# Patient Record
Sex: Female | Born: 1995 | Race: Black or African American | Hispanic: No | Marital: Single | Smoking: Current every day smoker
Health system: Southern US, Community
[De-identification: ages and names within clinical notes are randomized; demographics above are authoritative.]

## PROBLEM LIST (undated history)

## (undated) ENCOUNTER — Emergency Department (HOSPITAL_COMMUNITY): Admission: EM | Payer: Self-pay

## (undated) ENCOUNTER — Inpatient Hospital Stay (HOSPITAL_COMMUNITY): Payer: Self-pay

## (undated) ENCOUNTER — Inpatient Hospital Stay (HOSPITAL_COMMUNITY): Payer: Medicaid Other

## (undated) DIAGNOSIS — N938 Other specified abnormal uterine and vaginal bleeding: Secondary | ICD-10-CM

## (undated) DIAGNOSIS — F329 Major depressive disorder, single episode, unspecified: Secondary | ICD-10-CM

## (undated) DIAGNOSIS — N390 Urinary tract infection, site not specified: Secondary | ICD-10-CM

## (undated) DIAGNOSIS — Z3403 Encounter for supervision of normal first pregnancy, third trimester: Secondary | ICD-10-CM

## (undated) DIAGNOSIS — J302 Other seasonal allergic rhinitis: Secondary | ICD-10-CM

## (undated) DIAGNOSIS — F32A Depression, unspecified: Secondary | ICD-10-CM

## (undated) DIAGNOSIS — A549 Gonococcal infection, unspecified: Secondary | ICD-10-CM

## (undated) HISTORY — DX: Gonococcal infection, unspecified: A54.9

## (undated) HISTORY — DX: Urinary tract infection, site not specified: N39.0

## (undated) HISTORY — PX: NO PAST SURGERIES: SHX2092

---
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
## 2015-01-07 ENCOUNTER — Ambulatory Visit: Payer: Medicaid Other

## 2015-01-21 ENCOUNTER — Ambulatory Visit: Payer: Self-pay | Admitting: *Deleted

## 2015-01-22 ENCOUNTER — Encounter: Payer: Self-pay | Admitting: Licensed Clinical Social Worker

## 2015-01-25 ENCOUNTER — Encounter (INDEPENDENT_AMBULATORY_CARE_PROVIDER_SITE_OTHER): Payer: Self-pay

## 2015-01-25 ENCOUNTER — Ambulatory Visit (INDEPENDENT_AMBULATORY_CARE_PROVIDER_SITE_OTHER): Payer: Medicaid Other | Admitting: *Deleted

## 2015-01-25 ENCOUNTER — Ambulatory Visit: Payer: Medicaid Other

## 2015-01-25 DIAGNOSIS — Z309 Encounter for contraceptive management, unspecified: Secondary | ICD-10-CM

## 2015-01-25 DIAGNOSIS — Z3042 Encounter for surveillance of injectable contraceptive: Secondary | ICD-10-CM

## 2015-01-25 DIAGNOSIS — Z3049 Encounter for surveillance of other contraceptives: Secondary | ICD-10-CM | POA: Diagnosis not present

## 2015-01-25 MED ORDER — MEDROXYPROGESTERONE ACETATE 150 MG/ML IM SUSP
150.0000 mg | Freq: Once | INTRAMUSCULAR | Status: AC
  Administered 2015-01-25: 150 mg via INTRAMUSCULAR

## 2015-01-25 NOTE — Progress Notes (Signed)
Pt presents here today for depot. Pt within window, no pregnancy test needed. No previous history of reaction. No concerns. Tolerated well.

## 2015-02-05 ENCOUNTER — Ambulatory Visit: Payer: Medicaid Other | Admitting: Pediatrics

## 2015-02-06 ENCOUNTER — Institutional Professional Consult (permissible substitution): Payer: Medicaid Other | Admitting: Pediatrics

## 2015-02-14 ENCOUNTER — Institutional Professional Consult (permissible substitution): Payer: Medicaid Other | Admitting: Pediatrics

## 2015-02-14 ENCOUNTER — Encounter: Payer: Self-pay | Admitting: Pediatrics
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
## 2015-03-28 ENCOUNTER — Ambulatory Visit: Payer: Medicaid Other | Admitting: *Deleted

## 2015-04-03 ENCOUNTER — Ambulatory Visit: Payer: Medicaid Other | Admitting: Pediatrics

## 2015-04-08 ENCOUNTER — Ambulatory Visit: Payer: Medicaid Other | Admitting: *Deleted

## 2015-04-12 ENCOUNTER — Ambulatory Visit (INDEPENDENT_AMBULATORY_CARE_PROVIDER_SITE_OTHER): Payer: Medicaid Other | Admitting: *Deleted

## 2015-04-12 ENCOUNTER — Encounter (INDEPENDENT_AMBULATORY_CARE_PROVIDER_SITE_OTHER): Payer: Self-pay

## 2015-04-12 DIAGNOSIS — Z309 Encounter for contraceptive management, unspecified: Secondary | ICD-10-CM | POA: Diagnosis not present

## 2015-04-12 DIAGNOSIS — Z30013 Encounter for initial prescription of injectable contraceptive: Secondary | ICD-10-CM | POA: Diagnosis not present

## 2015-04-12 DIAGNOSIS — Z3042 Encounter for surveillance of injectable contraceptive: Secondary | ICD-10-CM

## 2015-04-12 MED ORDER — MEDROXYPROGESTERONE ACETATE 150 MG/ML IM SUSP
150.0000 mg | Freq: Once | INTRAMUSCULAR | Status: AC
  Administered 2015-04-12: 150 mg via INTRAMUSCULAR

## 2015-04-12 NOTE — Progress Notes (Signed)
Patient here for injection of depo. Within window of calendar so pregnancy test not needed.

## 2015-04-15 ENCOUNTER — Ambulatory Visit: Payer: Medicaid Other | Admitting: *Deleted

## 2015-04-19 ENCOUNTER — Institutional Professional Consult (permissible substitution): Payer: Medicaid Other | Admitting: Family

## 2015-05-07 ENCOUNTER — Encounter: Payer: Self-pay | Admitting: Pediatrics

## 2015-05-10 ENCOUNTER — Encounter: Payer: Self-pay | Admitting: Family

## 2015-05-10 ENCOUNTER — Ambulatory Visit (INDEPENDENT_AMBULATORY_CARE_PROVIDER_SITE_OTHER): Payer: Medicaid Other | Admitting: Family

## 2015-05-10 VITALS — BP 103/66 | HR 91 | Ht 65.35 in | Wt 181.6 lb

## 2015-05-10 DIAGNOSIS — Z130 Encounter for screening for diseases of the blood and blood-forming organs and certain disorders involving the immune mechanism: Secondary | ICD-10-CM

## 2015-05-10 DIAGNOSIS — A6000 Herpesviral infection of urogenital system, unspecified: Secondary | ICD-10-CM | POA: Diagnosis not present

## 2015-05-10 DIAGNOSIS — Z3042 Encounter for surveillance of injectable contraceptive: Secondary | ICD-10-CM | POA: Diagnosis not present

## 2015-05-10 DIAGNOSIS — Z1329 Encounter for screening for other suspected endocrine disorder: Secondary | ICD-10-CM

## 2015-05-10 DIAGNOSIS — Z13228 Encounter for screening for other metabolic disorders: Secondary | ICD-10-CM

## 2015-05-10 DIAGNOSIS — Z1321 Encounter for screening for nutritional disorder: Secondary | ICD-10-CM

## 2015-05-10 DIAGNOSIS — Z139 Encounter for screening, unspecified: Secondary | ICD-10-CM | POA: Diagnosis not present

## 2015-05-10 MED ORDER — ACYCLOVIR 400 MG PO TABS: ORAL_TABLET | ORAL | Status: DC

## 2015-05-10 NOTE — Progress Notes (Signed)
Adolescent Medicine Consultation Follow-Up Visit Taylor Reed  is a 19 y.o. female referred by Burnard Hawthorne, MD here today for follow-up of .   Previsit planning completed:  no  Growth Chart Viewed? no  PCP Confirmed?  Yes, Marge Duncans, MD    History was provided by the patient.  HPI:  19 yo female presents for consult. Her PMH is significant for HSV, hx of gonorrhea. She denies being currently active and she has had no lesions since the primary genital outbreak on 12/13/14. She describes that outbreak as one ulcerative lesion she noted after burning sensation with urination. Since that time, she had one other incident when she started to have an itch in the genital area. At that time, she took acyclovir and never had an outbreak. Does not have any issues currently but is requesting refill on acyclovir just in case. Has been on Depo for period regularity and birth control since 11th grade. She has no other complains or concerns today although she does question the impact of HSV on future pregnancies. When asked about desires to become pregnant, she states she does not want a family any time soon; Desires to go to school first.  Her mother requests Vitamin D level to be tested; states she has low Vit D level and was also on Depo for a long period of time. Mom states she gives her Vitamin D supplement but not daily.   Patient's last menstrual period was 04/22/2015 (approximate).  The following portions of the patient's history were reviewed and updated as appropriate: allergies, current medications, past family history, past medical history, past social history, past surgical history and problem list.  Allergies  Allergen Reactions  . Latex Rash    Social History: Sleep:  10pm-9a, no early morning wakings Eating Habits: reports junk food, twice daily meals, little appetite Screen Time: on phone most of day, no TV Exercise: walking around neighborhood  School: graduated from [employer]: was supposed to start school last semester, [employer] in fall for early childhood development Lives with mom, sister and 2 brothers  Confidentiality was discussed with the patient and if applicable, with caregiver as well.  Patient's personal or confidential phone number: [email or web address]  Tobacco? Yes, occasional cigarette smoking 2/day Secondhand smoke exposure?yes Drugs/EtOH?no Sexually active?no Pregnancy Prevention: depo, reviewed condoms & plan B Safe at home, in school & in relationships? Yes Guns in the home? no Safe to self? Yes  Review of Systems  Constitutional: Negative.   HENT: Negative.   Eyes: Negative.   Respiratory: Negative.   Cardiovascular: Negative.   Gastrointestinal: Negative.   Genitourinary: Negative.   Musculoskeletal: Negative.   Skin: Negative.   Neurological: Negative.   Endo/Heme/Allergies: Negative.   Psychiatric/Behavioral: Negative.    Physical Exam:  Filed Vitals:   05/10/15 1426  BP: 103/66  Pulse: 91  Height: 5' 5.35" (1.66 m)  Weight: 181 lb 9.6 oz (82.373 kg)   BP 103/66 mmHg  Pulse 91  Ht 5' 5.35" (1.66 m)  Wt 181 lb 9.6 oz (82.373 kg)  BMI 29.89 kg/m2  LMP 04/22/2015 (Approximate) Body mass index: body mass index is 29.89 kg/(m^2). Blood pressure percentiles are 22% systolic and 53% diastolic based on 2000 NHANES data. Blood pressure percentile targets: 90: 125/79, 95: 128/83, 99 + 5 mmHg: 141/96.  Physical Exam  Constitutional: She is oriented to person, place, and time. She appears well-developed. No distress.  HENT:  Head: Normocephalic and atraumatic.  Eyes: EOM are normal.  Pupils are equal, round, and reactive to light. No scleral icterus.  Neck: Normal range of motion. Neck supple. No thyromegaly present.  Cardiovascular: Normal rate, regular rhythm, normal heart sounds and intact distal pulses.   No murmur heard. Pulmonary/Chest: Effort normal and breath sounds normal.  Abdominal: Soft.   Musculoskeletal: Normal range of motion. She exhibits no edema.  Lymphadenopathy:    She has no cervical adenopathy.  Neurological: She is alert and oriented to person, place, and time. No cranial nerve deficit.  Skin: Skin is warm and dry. No rash noted.  Psychiatric: She has a normal mood and affect. Her behavior is normal. Judgment and thought content normal.  Nursing note and vitals reviewed.  Assessment/Plan:  1. Herpes genitalia Refill for use at onset of symptoms; asymptomatic since primary outbreak. Discussed prodromal symptoms to be aware of and also addressed her concerns related to future pregnancies. She was advised to use condoms with all sexual encounters. Also discussed suppression therapy as option if more outbreaks or concern for viral shedding. Reassurance was given to patient. She had no further questions or concerns.  - acyclovir (ZOVIRAX) 400 MG tablet; Take 400 mg three times daily for 5 days if lesions recur.  Dispense: 15 tablet; Refill: 0  2. Screening for endocrine, nutritional, metabolic and immunity disorder Pending, encouraged to continue daily intake of Vit D supplement  - Vit D  25 hydroxy (rtn osteoporosis monitoring)  3. Surveillance of Depo-Provera contraception  -Patient desires to continue Depo; discussed LARC options if she desires a long-term contraception method. Condoms reviewed and offered.   Follow-up:  Return in about 8 weeks (around 07/02/2015) for scheduled Depo injection; return PRN .   Medical decision-making:  > 40 minutes spent, more than 50% of appointment was spent discussing diagnosis and management of symptoms

## 2015-07-02 ENCOUNTER — Encounter: Payer: Self-pay | Admitting: Pediatrics

## 2015-07-02 ENCOUNTER — Ambulatory Visit (INDEPENDENT_AMBULATORY_CARE_PROVIDER_SITE_OTHER): Payer: Medicaid Other | Admitting: Pediatrics

## 2015-07-02 VITALS — BP 102/64 | Ht 63.19 in | Wt 180.1 lb

## 2015-07-02 DIAGNOSIS — Z113 Encounter for screening for infections with a predominantly sexual mode of transmission: Secondary | ICD-10-CM | POA: Diagnosis not present

## 2015-07-02 DIAGNOSIS — Z68.41 Body mass index (BMI) pediatric, greater than or equal to 95th percentile for age: Secondary | ICD-10-CM | POA: Diagnosis not present

## 2015-07-02 DIAGNOSIS — Z3042 Encounter for surveillance of injectable contraceptive: Secondary | ICD-10-CM | POA: Diagnosis not present

## 2015-07-02 DIAGNOSIS — Z91040 Latex allergy status: Secondary | ICD-10-CM | POA: Diagnosis not present

## 2015-07-02 DIAGNOSIS — L709 Acne, unspecified: Secondary | ICD-10-CM

## 2015-07-02 DIAGNOSIS — Z00121 Encounter for routine child health examination with abnormal findings: Secondary | ICD-10-CM | POA: Diagnosis not present

## 2015-07-02 DIAGNOSIS — A6000 Herpesviral infection of urogenital system, unspecified: Secondary | ICD-10-CM

## 2015-07-02 DIAGNOSIS — IMO0002 Reserved for concepts with insufficient information to code with codable children: Secondary | ICD-10-CM

## 2015-07-02 LAB — CBC WITH DIFFERENTIAL/PLATELET
BASOS PCT: 0 % (ref 0–1)
Basophils Absolute: 0 10*3/uL (ref 0.0–0.1)
EOS ABS: 0.2 10*3/uL (ref 0.0–0.7)
EOS PCT: 3 % (ref 0–5)
HCT: 39.8 % (ref 36.0–46.0)
Hemoglobin: 13.1 g/dL (ref 12.0–15.0)
Lymphocytes Relative: 36 % (ref 12–46)
Lymphs Abs: 2.2 10*3/uL (ref 0.7–4.0)
MCH: 31.1 pg (ref 26.0–34.0)
MCHC: 32.9 g/dL (ref 30.0–36.0)
MCV: 94.5 fL (ref 78.0–100.0)
MONO ABS: 0.7 10*3/uL (ref 0.1–1.0)
MPV: 10.7 fL (ref 8.6–12.4)
Monocytes Relative: 11 % (ref 3–12)
Neutro Abs: 3 10*3/uL (ref 1.7–7.7)
Neutrophils Relative %: 50 % (ref 43–77)
Platelets: 282 10*3/uL (ref 150–400)
RBC: 4.21 MIL/uL (ref 3.87–5.11)
RDW: 13.5 % (ref 11.5–15.5)
WBC: 6 10*3/uL (ref 4.0–10.5)

## 2015-07-02 LAB — COMPREHENSIVE METABOLIC PANEL
ALT: 12 U/L (ref 5–32)
AST: 14 U/L (ref 12–32)
Albumin: 4 g/dL (ref 3.6–5.1)
Alkaline Phosphatase: 63 U/L (ref 47–176)
BUN: 9 mg/dL (ref 7–20)
CO2: 21 mmol/L (ref 20–31)
CREATININE: 0.75 mg/dL (ref 0.50–1.00)
Calcium: 9.5 mg/dL (ref 8.9–10.4)
Chloride: 110 mmol/L (ref 98–110)
Glucose, Bld: 107 mg/dL — ABNORMAL HIGH (ref 65–99)
Potassium: 4.1 mmol/L (ref 3.8–5.1)
Sodium: 142 mmol/L (ref 135–146)
Total Bilirubin: 0.3 mg/dL (ref 0.2–1.1)
Total Protein: 6.6 g/dL (ref 6.3–8.2)

## 2015-07-02 LAB — HEMOGLOBIN A1C
Hgb A1c MFr Bld: 5.4 % (ref <5.7)
MEAN PLASMA GLUCOSE: 108 mg/dL (ref <117)

## 2015-07-02 LAB — LIPID PANEL
CHOLESTEROL: 128 mg/dL (ref 125–170)
HDL: 44 mg/dL (ref 36–76)
LDL Cholesterol: 74 mg/dL (ref <110)
Total CHOL/HDL Ratio: 2.9 Ratio (ref <=5.0)
Triglycerides: 52 mg/dL (ref 40–136)
VLDL: 10 mg/dL (ref <30)

## 2015-07-02 MED ORDER — MEDROXYPROGESTERONE ACETATE 150 MG/ML IM SUSP
150.0000 mg | Freq: Once | INTRAMUSCULAR | Status: AC
  Administered 2015-07-02: 150 mg via INTRAMUSCULAR

## 2015-07-02 MED ORDER — TRETINOIN 0.1 % EX CREA: TOPICAL_CREAM | Freq: Every day | CUTANEOUS | Status: DC

## 2015-07-02 NOTE — Progress Notes (Signed)
Routine Well-Adolescent Visit  PCP: Burnard Hawthorne, MD   History was provided by the patient.  Taylor Reed is a 19 y.o. female who is here for well teen check.  Current concerns: no concerns, no herpetic breakouts.  Adolescent Assessment:  Confidentiality was discussed with the patient and if applicable, with caregiver as well.  Home and Environment:  Lives with: lives at home with mom, and two brothers, and sister  Nutrition/Eating Behaviors: eats a varied diet, lots of fast foods, milk, water, juice to drink at home Sports/Exercise:  Walks some  Education and Employment:  School Status: has graduated from [employer], looking for a job   With parent out of the room and confidentiality discussed:     Smoking: yes, cigarettes, every once in a while, marijuana a lot, 3-4 times a week  Drugs/EtOH: sometimes, has gotten drunk before   Menstruation:   Menarche: post menarchal, onset at age 23 last menses if female: last period was in July, gets depo, period currently started a few days ago, heavy.  Periods have been irregular on the depo, seeems like she gets a period when she is getting near her next depo Menstrual History: flow is moderate and but usually only right before 3 month depo shot   Sexuality:hetero Sexually active? yes - last encounter about a month ago  contraception use: depo and condoms  Mood: Suicidality and Depression: no Weapons: no  Screenings: The patient completed the Rapid Assessment for Adolescent Preventive Services screening questionnaire and the following topics were  discussed: healthy eating, exercise, seatbelt use, tobacco use, marijuana use, drug use and condom use    PHQ-9 completed and results indicated no depression  Physical Exam:  BP 102/64 mmHg  Ht 5' 3.19" (1.605 m)  Wt 180 lb 2 oz (81.704 kg)  BMI 31.72 kg/m2  LMP 05/01/2015 (Approximate) Blood pressure percentiles are 24% systolic and 50% diastolic based on 2000 NHANES  data.   General Appearance:   alert, oriented, no acute distress, well nourished and overweight  HENT: Normocephalic, no obvious abnormality, conjunctiva clear  Mouth:   Normal appearing teeth, no obvious discoloration, dental caries, or dental caps  Neck:   Supple; thyroid: no enlargement, symmetric, no tenderness/mass/nodules  Lungs:   Clear to auscultation bilaterally, normal work of breathing  Heart:   Regular rate and rhythm, S1 and S2 normal, no murmurs;   Abdomen:   Soft, non-tender, no mass, or organomegaly  GU normal female external genitalia, pelvic not performed  Musculoskeletal:   Tone and strength strong and symmetrical, all extremities               Lymphatic:   No cervical adenopathy  Skin/Hair/Nails:   Skin warm, dry and intact, no rashes, no bruises or petechiae, acne pustules on cheeks, stretch marks on abdomen  Neurologic:   Strength, gait, and coordination normal and age-appropriate    Assessment/Plan: 1. Encounter for routine child health examination with abnormal findings BMI: is not appropriate for age  Immunizations today: per orders.  - restart Vitamin D 2000 to 5000 iu per day OTD Vitamin D 3  - Comprehensive metabolic panel - CBC with Differential - Vit D  25 hydroxy (rtn osteoporosis monitoring) - Lipid panel - Hemoglobin A1c - TSH + free T4  2. Routine screening for STI (sexually transmitted infection)  - GC/chlamydia probe amp, urine  3. BMI (body mass index), pediatric, greater than or equal to 95% for age - discussed trying to drink water with splash  of juice instead of juice - discussed trying a phone app to count steps and aim at 5000 steps then up to 10,000 steps after she gets a good start   4. Latex allergy - known entitiy  5. Herpes genitalia, history of, no current active lesions - inactive at this time  6. Acne, unspecified acne type - wash face nightly with antibacterial soap - tretinoin (RETIN-A) 0.1 % cream; Apply topically  at bedtime.  Dispense: 45 g; Refill: 5  7. Encounter for surveillance of injectable contraceptive  - medroxyPROGESTERone (DEPO-PROVERA) injection 150 mg; Inject 1 mL (150 mg total) into the muscle once.  - Follow-up visit in 1 year for next visit, or sooner as needed.   Burnard Hawthorne, MD   Shea Evans, MD [facility] for [facility], [address] [place], [place] [address] [phone] 07/02/2015 5:33 PM

## 2015-07-02 NOTE — Patient Instructions (Signed)
Dental list         Updated 7.28.16 These dentists all accept Medicaid.  The list is for your convenience in choosing your child's dentist. Estos dentistas aceptan Medicaid.  La lista es para su conveniencia y es una cortesa.     [facility]     [phone] [address]  [address] [place] [address] Se habla espaol From 1 to 19 years old Parent may go with child only for cleaning Bryan Cobb DDS     [phone] [address] [place]  [address] Se habla espaol From 2 to 13 years old Parent may NOT go with child  [facility]    [phone] [address] [place] [address] Se habla espaol Vietnamese spoken From 2 years old Parent may go with child [facility]     [phone] [address] [place] [address] Se habla espaol From 1 to 20 years old Parent may NOT go with child  Thane Hisaw DDS     [phone] [facility]     [address]  [place] [address] From teeth coming in - 10 years old Parent may go with child  [facility]     [phone] [address] [place] [address] Requires certification. Call for information. Requiere certificacin. Llame para informacin. Algunos dias se habla espaol  From birth to 20 years Parent possibly goes with child  Herbert McNeal DDS     [phone] [address]  [address] [place] [address] Se habla espaol From 18 months to 18 years  Parent may go with child  J. Howard McMasters DDS    [phone] Eric J. Sadler DDS [address] [place] [address] Se habla espaol From 1 year old Parent may go with child  Perry Jeffries DDS    [phone] [address] [place] [address] Se habla espaol  From 18 months - 18 years old Parent may go with child J. Selig Cooper DDS    [phone] [address] [place] [address] Se habla espaol From 5 to 26 years old Parent may go  with child  [facility]    [phone] [address] [place] [address] No se habla espaol From birth Parent may not go with child    

## 2015-07-03 ENCOUNTER — Telehealth: Payer: Self-pay | Admitting: Pediatrics

## 2015-07-03 LAB — GC/CHLAMYDIA PROBE AMP, URINE
Chlamydia, Swab/Urine, PCR: NEGATIVE
GC PROBE AMP, URINE: NEGATIVE

## 2015-07-03 LAB — VITAMIN D 25 HYDROXY (VIT D DEFICIENCY, FRACTURES): Vit D, 25-Hydroxy: 21 ng/mL — ABNORMAL LOW (ref 30–100)

## 2015-07-03 NOTE — Telephone Encounter (Signed)
Called home.  Reneta out job hunting.   Talked to mother that all labs normal except for a vitanim D which is still Low.   Mom reports they heve the Vitamin D at home but they just have not remembered to take it but will in the future!  Shea Evans, MD [facility], [address] [place], [place] [address] [phone] 07/03/2015 12:12 PM

## 2015-08-01 ENCOUNTER — Ambulatory Visit: Payer: Medicaid Other

## 2015-09-17 ENCOUNTER — Ambulatory Visit (INDEPENDENT_AMBULATORY_CARE_PROVIDER_SITE_OTHER): Payer: Medicaid Other | Admitting: *Deleted

## 2015-09-17 DIAGNOSIS — Z3042 Encounter for surveillance of injectable contraceptive: Secondary | ICD-10-CM

## 2015-09-17 DIAGNOSIS — Z3049 Encounter for surveillance of other contraceptives: Secondary | ICD-10-CM

## 2015-09-17 DIAGNOSIS — Z23 Encounter for immunization: Secondary | ICD-10-CM | POA: Diagnosis not present

## 2015-09-17 MED ORDER — MEDROXYPROGESTERONE ACETATE 150 MG/ML IM SUSP
150.0000 mg | Freq: Once | INTRAMUSCULAR | Status: AC
  Administered 2015-09-17: 150 mg via INTRAMUSCULAR

## 2015-09-17 NOTE — Progress Notes (Signed)
Pt here for depo injection and flu shot. Shots given. Tolerated well.

## 2015-11-15 ENCOUNTER — Telehealth: Payer: Self-pay

## 2015-11-15 NOTE — Telephone Encounter (Signed)
Will rout to red pod pool.

## 2015-11-15 NOTE — Telephone Encounter (Signed)
TC to mom. Mom states that she is on depo d/t issues w/ heavy bleeding. Mom states that bleeding varies-sometimes heavy,and sometimes is spotting. Mom states pt has been taking depo for over a year now, but that mom had this same problem. Offered to schedule appt w/ C Millican next week, but mom and pt declined at this time. Advised mom to call office back Monday to report how pt does over the weekend. Mom agreeable. Has office phone number.

## 2015-11-15 NOTE — Telephone Encounter (Signed)
Mom called requesting to speak to a nurse about her daughter. Mom has permission to speak on behalf of pt. She stated that pt started bleeding and is not due, she is on Depot to stop the heavy bleeding.

## 2015-12-04 ENCOUNTER — Ambulatory Visit (INDEPENDENT_AMBULATORY_CARE_PROVIDER_SITE_OTHER): Payer: Medicaid Other | Admitting: *Deleted

## 2015-12-04 DIAGNOSIS — Z3042 Encounter for surveillance of injectable contraceptive: Secondary | ICD-10-CM

## 2015-12-04 MED ORDER — MEDROXYPROGESTERONE ACETATE 150 MG/ML IM SUSP
150.0000 mg | Freq: Once | INTRAMUSCULAR | Status: AC
  Administered 2015-12-04: 150 mg via INTRAMUSCULAR

## 2015-12-04 NOTE — Progress Notes (Signed)
Pt presents for depo injection. Pt within depo window, no urine hcg needed. Injection given, tolerated well. F/u depo injection visit scheduled.   

## 2016-02-19 ENCOUNTER — Ambulatory Visit: Payer: Self-pay | Admitting: *Deleted

## 2016-02-26 ENCOUNTER — Ambulatory Visit: Payer: Medicaid Other | Admitting: *Deleted

## 2016-03-06 ENCOUNTER — Ambulatory Visit (INDEPENDENT_AMBULATORY_CARE_PROVIDER_SITE_OTHER): Payer: Medicaid Other | Admitting: Family

## 2016-03-06 ENCOUNTER — Encounter: Payer: Self-pay | Admitting: Family

## 2016-03-06 VITALS — BP 108/72 | HR 82 | Ht 64.0 in | Wt 181.2 lb

## 2016-03-06 DIAGNOSIS — Z130 Encounter for screening for diseases of the blood and blood-forming organs and certain disorders involving the immune mechanism: Secondary | ICD-10-CM | POA: Diagnosis not present

## 2016-03-06 DIAGNOSIS — Z3042 Encounter for surveillance of injectable contraceptive: Secondary | ICD-10-CM

## 2016-03-06 DIAGNOSIS — Z113 Encounter for screening for infections with a predominantly sexual mode of transmission: Secondary | ICD-10-CM

## 2016-03-06 DIAGNOSIS — Z3049 Encounter for surveillance of other contraceptives: Secondary | ICD-10-CM | POA: Diagnosis not present

## 2016-03-06 DIAGNOSIS — Z3202 Encounter for pregnancy test, result negative: Secondary | ICD-10-CM

## 2016-03-06 LAB — POCT HEMOGLOBIN: HEMOGLOBIN: 13.3 g/dL (ref 12.2–16.2)

## 2016-03-06 LAB — POCT URINE PREGNANCY: Preg Test, Ur: NEGATIVE

## 2016-03-06 MED ORDER — MEDROXYPROGESTERONE ACETATE 150 MG/ML IM SUSP
150.0000 mg | Freq: Once | INTRAMUSCULAR | Status: AC
Start: 2016-03-06 — End: 2016-03-06
  Administered 2016-03-06: 150 mg via INTRAMUSCULAR

## 2016-03-06 NOTE — Progress Notes (Signed)
Patient ID: Taylor MatteCourtney M Fleener, female   DOB: Jun 06, 1996, 20 y.o.   MRN: [id number] Pre-Visit Planning  Taylor Reed  is a 20 y.o. female referred by Gwenith Dailyherece Nicole Grier, MD.   Last seen in [facility] on 05/10/15 for HSV management and Vit D.    Last Depo here was on 12/04/15 without incident. Window is 05/3- 03/04/16.  She presents 2 days outside of Depo window.   Date and Type of Previous Psych Screenings? No  Clinical Staff Visit Tasks:   - Urine GC/CT due? yes - HIV Screening due?  no - Psych Screenings Due? NA [facility]- UHCG   Provider Visit Tasks: - Assess if need for EC from last 5 days. [facility]- BHC Involvement? No - Pertinent Labs? No  >3 minutes spent reviewing records and planning for patient's visit.

## 2016-03-06 NOTE — Progress Notes (Signed)
THIS RECORD MAY CONTAIN CONFIDENTIAL INFORMATION THAT SHOULD NOT BE RELEASED WITHOUT REVIEW OF THE SERVICE PROVIDER.  Adolescent Medicine Consultation Follow-Up Visit Taylor Reed  is a 20 y.o. female referred by Gwenith DailyGrier, Cherece Nicole, * here today for follow-up.    Previsit planning completed:  Yes Patient ID: Taylor Reed, female   DOB: [birth year], 20 y.o.   MRN: [id number] Pre-Visit Planning  Taylor Reed  is a 20 y.o. female referred by Gwenith Dailyherece Nicole Grier, MD.   Last seen in [facility] on 05/10/15 for HSV management and Vit D.    Last Depo here was on 12/04/15 without incident. Window is 05/3- 03/04/16.  She presents 2 days outside of Depo window.   Date and Type of Previous Psych Screenings? No  Clinical Staff Visit Tasks:   - Urine GC/CT due? yes - HIV Screening due?  no - Psych Screenings Due? NA [facility]- UHCG   Provider Visit Tasks: - Assess if need for EC from last 5 days. [facility]- [facility] Involvement? No - Pertinent Labs? No  >3 minutes spent reviewing records and planning for patient's visit.   Growth Chart Viewed? no   History was provided by the patient.  PCP Confirmed?  Raj JanusYes, Grier, MD   My Chart Activated?   Pending   HPI:    Not sexually active in last several months; 2 days outside of Depo window. Started period earlier this week; regular cycle. Denies vaginal discharge changes, pelvic/adominal pain; no HAs.  Desires to continue with Depo injections.  No other questios or concerns today.   Patient's last menstrual period was 03/03/2016. Allergies  Allergen Reactions  . Latex Rash   Outpatient Prescriptions Prior to Visit  Medication Sig Dispense Refill  . acyclovir (ZOVIRAX) 400 MG tablet Take 400 mg three times daily for 5 days if lesions recur. 15 tablet 0  . cetirizine (ZYRTEC) 10 MG tablet Take 1 tablet (10 mg total) by mouth daily. 30 tablet 11  . tretinoin (RETIN-A) 0.1 % cream Apply topically at bedtime. 45 g 5   No  facility-administered medications prior to visit.     Patient Active Problem List   Diagnosis Date Noted  . 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 Acne 07/02/2015  . Surveillance for Depo-Provera contraception 05/10/2015  . Herpes genitalia 11/08/2014     Confidentiality was discussed with the patient and if applicable, with caregiver as well.  Patient's personal or confidential phone number: [phone]   Physical Exam:  Filed Vitals:   03/06/16 1556  BP: 108/72  Pulse: 82  Height: 5\' 4"  (1.626 m)  Weight: 181 lb 3.2 oz (82.192 kg)   BP 108/72 mmHg  Pulse 82  Ht 5\' 4"  (1.626 m)  Wt 181 lb 3.2 oz (82.192 kg)  BMI 31.09 kg/m2  LMP 03/03/2016 Body mass index: body mass index is 31.09 kg/(m^2). Blood pressure percentiles are 48% systolic and 80% diastolic based on 2000 NHANES data. Blood pressure percentile targets: 90: 122/77, 95: 126/81, 99 + 5 mmHg: 138/93.  Physical Exam  Constitutional: She is oriented to person, place, and time. She appears well-developed. No distress.  HENT:  Head: Normocephalic and atraumatic.  Eyes: EOM are normal. Pupils are equal, round, and reactive to light. No scleral icterus.  Neck: Normal range of motion. Neck supple. No thyromegaly present.  Cardiovascular: Normal rate, regular rhythm, normal heart sounds and intact distal pulses.   No murmur heard. Pulmonary/Chest: Effort normal and breath sounds normal.  Abdominal: Soft.  Musculoskeletal: Normal  range of motion. She exhibits no edema.  Lymphadenopathy:    She has no cervical adenopathy.  Neurological: She is alert and oriented to person, place, and time. No cranial nerve deficit.  Skin: Skin is warm and dry.  Closed comedone acne cheeks, chin, forehead   Psychiatric: She has a normal mood and affect. Her behavior is normal. Judgment and thought content normal.  Vitals reviewed.   Assessment/Plan: 1. Depo contraception -continue with Depo; no need for EC  -reviewed LARC options. Patient  elects Depo coverage.   - medroxyPROGESTERone (DEPO-PROVERA) injection 150 mg; Inject 1 mL (150 mg total) into the muscle once.  2. Pregnancy examination or test, negative result -per protocol - POCT urine pregnancy  3. Routine screening for STI (sexually transmitted infection) Per protocol - GC/Chlamydia Probe Amp  4. Screening for iron deficiency anemia Stable Hgb, reviewed. - POCT hemoglobin   Follow-up:  Return for RN - DEPO VISIT.   Medical decision-making:  >15 minutes spent, more than 50% of appointment was spent discussing diagnosis and management of symptoms

## 2016-03-07 LAB — GC/CHLAMYDIA PROBE AMP
CT Probe RNA: NOT DETECTED
GC PROBE AMP APTIMA: NOT DETECTED

## 2016-03-09 ENCOUNTER — Telehealth: Payer: Self-pay | Admitting: *Deleted

## 2016-03-09 NOTE — Telephone Encounter (Signed)
-----   Message from Christianne Dolinhristy Millican, NP sent at 03/09/2016  7:10 AM EDT ----- Negative gc/c. Notify pt.

## 2016-03-09 NOTE — Telephone Encounter (Signed)
TC to pt. Pt unavailable per mom. Per mom's request, pt's pharmacy was updated in Epic. Pt to call back.

## 2016-03-09 NOTE — Telephone Encounter (Signed)
Vm from pt returning message. Pt can be reached at: [phone].  TC returned to pt. Advised that gc/c were negative.

## 2016-05-13 ENCOUNTER — Encounter: Payer: Self-pay | Admitting: Pediatrics

## 2016-05-14 ENCOUNTER — Encounter: Payer: Self-pay | Admitting: Pediatrics

## 2016-05-22 ENCOUNTER — Ambulatory Visit: Payer: Self-pay | Admitting: *Deleted

## 2016-05-29 ENCOUNTER — Ambulatory Visit (INDEPENDENT_AMBULATORY_CARE_PROVIDER_SITE_OTHER): Payer: Medicaid Other | Admitting: *Deleted

## 2016-05-29 DIAGNOSIS — Z3049 Encounter for surveillance of other contraceptives: Secondary | ICD-10-CM | POA: Diagnosis not present

## 2016-05-29 DIAGNOSIS — Z3042 Encounter for surveillance of injectable contraceptive: Secondary | ICD-10-CM

## 2016-05-29 MED ORDER — MEDROXYPROGESTERONE ACETATE 150 MG/ML IM SUSP
150.0000 mg | Freq: Once | INTRAMUSCULAR | Status: AC
  Administered 2016-05-29: 150 mg via INTRAMUSCULAR

## 2016-05-29 NOTE — Progress Notes (Signed)
Pt presents for depo injection. Pt within depo window, no urine hcg needed. Injection given, tolerated well. F/u depo injection visit scheduled.   

## 2016-06-16 ENCOUNTER — Ambulatory Visit (INDEPENDENT_AMBULATORY_CARE_PROVIDER_SITE_OTHER): Payer: Medicaid Other | Admitting: Pediatrics

## 2016-06-16 ENCOUNTER — Encounter: Payer: Self-pay | Admitting: Pediatrics

## 2016-06-16 VITALS — Temp 97.8°F | Wt 193.2 lb

## 2016-06-16 DIAGNOSIS — J069 Acute upper respiratory infection, unspecified: Secondary | ICD-10-CM

## 2016-06-16 NOTE — Patient Instructions (Signed)

## 2016-06-16 NOTE — Progress Notes (Signed)
History was provided by the patient.  Taylor Reed is a 20 y.o. female presents  Chief Complaint  Patient presents with  . Cough    X1week  . Other    pt has been around friends daughter who was sick coughing.   . Sore Throat  . Nasal Congestion     The symptoms have improved over this time.  No fevers.  Symptoms started 4 days ago.  No vomiting.  Normal voids.  No longer taking zyrtec.  Sick friend's daughter developed symptoms before her.    The following portions of the patient's history were reviewed and updated as appropriate: allergies, current medications, past family history, past medical history, past social history, past surgical history and problem list.  Review of Systems  Constitutional: Negative for fever and weight loss.  HENT: Positive for congestion and sore throat. Negative for ear discharge and ear pain.   Eyes: Negative for pain, discharge and redness.  Respiratory: Positive for cough. Negative for shortness of breath.   Cardiovascular: Negative for chest pain.  Gastrointestinal: Negative for diarrhea and vomiting.  Genitourinary: Negative for frequency and hematuria.  Musculoskeletal: Negative for back pain, falls and neck pain.  Skin: Negative for rash.  Neurological: Negative for speech change, loss of consciousness and weakness.  Endo/Heme/Allergies: Does not bruise/bleed easily.  Psychiatric/Behavioral: The patient does not have insomnia.      Physical Exam:  Temp 97.8 F (36.6 C)   Wt 193 lb 3.2 oz (87.6 kg)   BMI 33.16 kg/m   Growth percentile SmartLinks can only be used for patients less than 20 years old. HR: 60 RR: 18  General:   alert, cooperative, appears stated age and no distress  Oral cavity:   lips, mucosa, and tongue normal; teeth and gums normal  Eyes:   sclerae white  Ears:   normal bilaterally  Nose: Clear discharge, no nasal flaring  Neck:  Neck appearance: Normal  Lungs:  clear to auscultation bilaterally  Heart:   regular  rate and rhythm, S1, S2 normal, no murmur, click, rub or gallop   Neuro:  normal without focal findings     Assessment/Plan: 1. Viral URI On a side note we discussed her transitioning to adult care and what that looks like we also discussed her birth control options but she seems satisfied with Depo at this time.  - discussed maintenance of good hydration - discussed signs of dehydration - discussed management of fever - discussed expected course of illness - discussed good hand washing and use of hand sanitizer - discussed with parent to report increased symptoms or no improvement      Cherece Griffith CitronNicole Grier, MD  06/16/16

## 2016-08-14 ENCOUNTER — Ambulatory Visit (INDEPENDENT_AMBULATORY_CARE_PROVIDER_SITE_OTHER): Payer: Medicaid Other | Admitting: *Deleted

## 2016-08-14 DIAGNOSIS — Z3049 Encounter for surveillance of other contraceptives: Secondary | ICD-10-CM | POA: Diagnosis not present

## 2016-08-14 DIAGNOSIS — Z23 Encounter for immunization: Secondary | ICD-10-CM

## 2016-08-14 MED ORDER — MEDROXYPROGESTERONE ACETATE 150 MG/ML IM SUSP
150.0000 mg | Freq: Once | INTRAMUSCULAR | Status: AC
  Administered 2016-08-14: 150 mg via INTRAMUSCULAR

## 2016-08-14 NOTE — Progress Notes (Signed)
Pt presents for depo injection. Pt within depo window, no urine hcg needed. Injection given, tolerated well. F/u depo injection visit scheduled.   

## 2016-10-30 ENCOUNTER — Ambulatory Visit (INDEPENDENT_AMBULATORY_CARE_PROVIDER_SITE_OTHER): Payer: Medicaid Other | Admitting: *Deleted

## 2016-10-30 DIAGNOSIS — Z3049 Encounter for surveillance of other contraceptives: Secondary | ICD-10-CM

## 2016-10-30 DIAGNOSIS — Z3042 Encounter for surveillance of injectable contraceptive: Secondary | ICD-10-CM

## 2016-10-30 MED ORDER — MEDROXYPROGESTERONE ACETATE 150 MG/ML IM SUSP
150.0000 mg | Freq: Once | INTRAMUSCULAR | Status: AC
Start: 2016-10-30 — End: 2016-10-30
  Administered 2016-10-30: 150 mg via INTRAMUSCULAR

## 2016-10-30 NOTE — Progress Notes (Signed)
Pt presents for depo injection. Pt within depo window, no urine hcg needed. Injection given, tolerated well. F/u depo injection visit scheduled.   

## 2017-01-15 ENCOUNTER — Ambulatory Visit (INDEPENDENT_AMBULATORY_CARE_PROVIDER_SITE_OTHER): Payer: Medicaid Other | Admitting: *Deleted

## 2017-01-15 DIAGNOSIS — Z3042 Encounter for surveillance of injectable contraceptive: Secondary | ICD-10-CM | POA: Diagnosis not present

## 2017-01-15 MED ORDER — MEDROXYPROGESTERONE ACETATE 150 MG/ML IM SUSP
150.0000 mg | Freq: Once | INTRAMUSCULAR | Status: AC
  Administered 2017-01-15: 150 mg via INTRAMUSCULAR

## 2017-01-15 NOTE — Progress Notes (Signed)
Pt presents for depo injection. Pt within depo window, no urine hcg needed. Injection given, tolerated well. F/u depo injection visit scheduled.   

## 2017-04-02 ENCOUNTER — Ambulatory Visit: Payer: Self-pay

## 2017-04-06 ENCOUNTER — Encounter: Payer: Self-pay | Admitting: Pediatrics

## 2017-04-06 ENCOUNTER — Ambulatory Visit (INDEPENDENT_AMBULATORY_CARE_PROVIDER_SITE_OTHER): Payer: Medicaid Other | Admitting: Pediatrics

## 2017-04-06 VITALS — Temp 98.0°F | Wt 212.4 lb

## 2017-04-06 DIAGNOSIS — Z3042 Encounter for surveillance of injectable contraceptive: Secondary | ICD-10-CM

## 2017-04-06 DIAGNOSIS — B309 Viral conjunctivitis, unspecified: Secondary | ICD-10-CM

## 2017-04-06 DIAGNOSIS — Z3202 Encounter for pregnancy test, result negative: Secondary | ICD-10-CM

## 2017-04-06 LAB — POCT URINE PREGNANCY: PREG TEST UR: NEGATIVE

## 2017-04-06 MED ORDER — POLYMYXIN B-TRIMETHOPRIM 10000-0.1 UNIT/ML-% OP SOLN: 1.0000 [drp] | Freq: Four times a day (QID) | OPHTHALMIC | 0 refills | Status: DC

## 2017-04-06 MED ORDER — POLYMYXIN B-TRIMETHOPRIM 10000-0.1 UNIT/ML-% OP SOLN: 1.0000 [drp] | OPHTHALMIC | 0 refills | Status: DC

## 2017-04-06 MED ORDER — MEDROXYPROGESTERONE ACETATE 150 MG/ML IM SUSP
150.0000 mg | Freq: Once | INTRAMUSCULAR | Status: AC
  Administered 2017-04-06: 150 mg via INTRAMUSCULAR

## 2017-04-06 NOTE — Patient Instructions (Addendum)
You can pick up drops from [facility].  Viral Conjunctivitis, Adult Viral conjunctivitis is an inflammation of the clear membrane that covers the white part of your eye and the inner surface of your eyelid (conjunctiva). The inflammation is caused by a viral infection. The blood vessels in the conjunctiva become inflamed, causing the eye to become red or pink, and often itchy. Viral conjunctivitis can be easily passed from one person to another (is contagious). This condition is often called pink eye. What are the causes? This condition is caused by a virus. A virus is a type of contagious germ. It can be spread by touching objects that have been contaminated with the virus, such as doorknobs or towels. It can also be passed through droplets, such as from coughing or sneezing. What are the signs or symptoms? Symptoms of this condition include:  Eye redness.  Tearing or watery eyes.  Itchy and irritated eyes.  Burning feeling in the eyes.  Clear drainage from the eye.  Swollen eyelids.  A gritty feeling in the eye.  Light sensitivity.  This condition often occurs with other symptoms, such as a fever, nausea, or a rash. How is this diagnosed? This condition is diagnosed with a medical history and physical exam. If you have discharge from your eye, the discharge may be tested to rule out other causes of conjunctivitis. How is this treated? Viral conjunctivitis does not respond to medicines that kill bacteria (antibiotics). Treatment for viral conjunctivitis is directed at stopping a bacterial infection from developing in addition to the viral infection. Treatment also aims to relieve your symptoms, such as itching. This may be done with antihistamine drops or other eye medicines. Rarely, steroid eye drops or antiviral medicines may be prescribed. Follow these instructions at home: Medicines   Take or apply over-the-counter and prescription medicines only as told by your health care  provider.  Be very careful to avoid touching the edge of the eyelid with the eye drop bottle or ointment tube when applying medicines to the affected eye. Being careful this way will stop you from spreading the infection to the other eye or to other people. Eye care  Avoid touching or rubbing your eyes.  Apply a warm, wet, clean washcloth to your eye for 10-20 minutes, 3-4 times per day or as told by your health care provider.  If you wear contact lenses, do not wear them until the inflammation is gone and your health care provider says it is safe to wear them again. Ask your health care provider how to sterilize or replace your contact lenses before using them again. Wear glasses until you can resume wearing contacts.  Avoid wearing eye makeup until the inflammation is gone. Throw away any old eye cosmetics that may be contaminated.  Gently wipe away any drainage from your eye with a warm, wet washcloth or a cotton ball. General instructions  Change or wash your pillowcase every day or as told by your health care provider.  Do not share towels, pillowcases, washcloths, eye makeup, makeup brushes, contact lenses, or glasses. This may spread the infection.  Wash your hands often with soap and water. Use paper towels to dry your hands. If soap and water are not available, use hand sanitizer.  Try to avoid contact with other people for one week or as told by your health care provider. Contact a health care provider if:  Your symptoms do not improve with treatment or they get worse.  You have increased pain.  Your vision becomes blurry.  You have a fever.  You have facial pain, redness, or swelling.  You have yellow or green drainage coming from your eye.  You have new symptoms. This information is not intended to replace advice given to you by your health care provider. Make sure you discuss any questions you have with your health care provider. Document Released: 12/26/2002  Document Revised: 05/02/2016 Document Reviewed: 04/21/2016 Elsevier Interactive Patient Education  2017 [employer].

## 2017-04-06 NOTE — Progress Notes (Signed)
History was provided by the patient.  Taylor Reed is a 21 y.o. female who is here for her depo shot and red, watery eyes.     HPI:    Depo shots for about 2 years. Still using condoms. No concerns with depo. Not interested in other forms of birth control. Only bleeds about q3 months when almost due for shot.  Friday felt like yes was itching. Mom gave eye drops, only 1 was red/pink in color. Used olopatadine, got better. Had been using eye drops and cleared up, and woke up and both red again, x2 days. Now don't itch. Just feels like watering. Crusty stuff on eyes, wipes off with wet wash cloth. No changes in vision. No sensation of something in her eye. Does not use contacts.  Didn't get anything in eyes. Not itching or burning. Eye drops not helping with current red eyes. Eyes feel swollen, no fevers.   ROS: All 10 systems reviewed and are negative except as stated in the HPI   The following portions of the patient's history were reviewed and updated as appropriate: allergies, current medications, past family history, past medical history, past social history, past surgical history and problem list.  Physical Exam:  Temp 98 F (36.7 C) (Temporal)   Wt 212 lb 6.4 oz (96.3 kg)   LMP  (LMP Unknown)   BMI 36.46 kg/m   Growth percentile SmartLinks can only be used for patients less than 21 years old. No LMP recorded (lmp unknown).    General:   alert, cooperative, appears stated age and no distress  Skin:   normal  Oral cavity:   lips, mucosa, and tongue normal; teeth and gums normal  Eyes:   bright red conjunctiva of bilateral eyes, very watery eyes, PERRLA, EOMI, non-tender  Ears:   normal bilaterally  Nose: clear discharge  Neck:  Supple, no lymphadenopathy  Lungs:  clear to auscultation bilaterally  Heart:   regular rate and rhythm, S1, S2 normal, no murmur, click, rub or gallop   Extremities:   extremities normal, atraumatic, no cyanosis or edema  Neuro:  normal without  focal findings    Hearing Screening   Method: Audiometry   125Hz  250Hz  500Hz  1000Hz  2000Hz  3000Hz  4000Hz  6000Hz  8000Hz   Right ear:           Left ear:             Visual Acuity Screening   Right eye Left eye Both eyes  Without correction: 20/20 20/20 20/20   With correction:        Assessment/Plan: Taylor Reed is a 21 y.o. female who is here for bilateral eye redness and depo shot. No concern regarding depo shot. For her red eyes- no fever, gritty sensation, eye pain, vision changes, eyelid swelling, or other red flags. Likely viral since bilateral and watery discharge, but will treat for bacterial.  1. Encounter for surveillance of injectable contraceptive - medroxyPROGESTERone (DEPO-PROVERA) injection 150 mg; Inject 1 mL (150 mg total) into the muscle once.  2. Acute viral conjunctivitis of both eyes - trimethoprim-polymyxin b (POLYTRIM) ophthalmic solution; Place 1 drop into both eyes every 6 (six) hours.  Dispense: 10 mL; Refill: 0 - return precautions discussed, including fever, eye pain, changes in vision  - Immunizations today: none  - Follow-up visit in 3 months for depo shot, or sooner as needed.    Karmen StabsE. Paige Prosperity Darrough, MD [facility], PGY-3 04/06/2017  9:38 AM

## 2017-04-22 ENCOUNTER — Emergency Department (HOSPITAL_COMMUNITY)
Admission: EM | Admit: 2017-04-22 | Discharge: 2017-04-22 | Disposition: A | Discharge status: Home / Self Care | Payer: Medicaid Other | Attending: Emergency Medicine | Admitting: Emergency Medicine

## 2017-04-22 ENCOUNTER — Encounter (HOSPITAL_COMMUNITY): Payer: Self-pay | Admitting: Emergency Medicine

## 2017-04-22 DIAGNOSIS — Y929 Unspecified place or not applicable: Secondary | ICD-10-CM | POA: Diagnosis not present

## 2017-04-22 DIAGNOSIS — Y939 Activity, unspecified: Secondary | ICD-10-CM | POA: Diagnosis not present

## 2017-04-22 DIAGNOSIS — Z79899 Other long term (current) drug therapy: Secondary | ICD-10-CM | POA: Insufficient documentation

## 2017-04-22 DIAGNOSIS — X58XXXA Exposure to other specified factors, initial encounter: Secondary | ICD-10-CM | POA: Insufficient documentation

## 2017-04-22 DIAGNOSIS — Z91040 Latex allergy status: Secondary | ICD-10-CM | POA: Insufficient documentation

## 2017-04-22 DIAGNOSIS — S61309A Unspecified open wound of unspecified finger with damage to nail, initial encounter: Secondary | ICD-10-CM

## 2017-04-22 DIAGNOSIS — S61315A Laceration without foreign body of left ring finger with damage to nail, initial encounter: Secondary | ICD-10-CM | POA: Diagnosis not present

## 2017-04-22 DIAGNOSIS — Y999 Unspecified external cause status: Secondary | ICD-10-CM | POA: Insufficient documentation

## 2017-04-22 DIAGNOSIS — S6982XA Other specified injuries of left wrist, hand and finger(s), initial encounter: Secondary | ICD-10-CM | POA: Diagnosis present

## 2017-04-22 DIAGNOSIS — F17210 Nicotine dependence, cigarettes, uncomplicated: Secondary | ICD-10-CM | POA: Insufficient documentation

## 2017-04-22 MED ORDER — BUPIVACAINE HCL (PF) 0.5 % IJ SOLN
10.0000 mL | Freq: Once | INTRAMUSCULAR | Status: AC
  Administered 2017-04-22: 10 mL
  Filled 2017-04-22: qty 10

## 2017-04-22 MED ORDER — LIDOCAINE HCL (PF) 1 % IJ SOLN
5.0000 mL | Freq: Once | INTRAMUSCULAR | Status: AC
  Administered 2017-04-22: 5 mL
  Filled 2017-04-22: qty 5

## 2017-04-22 NOTE — ED Triage Notes (Signed)
Patient arrives with complain of severe left ring finger pain. Patient has a decorative bow attached to that nail. It became snagged and the nail is partially sheared off. Patient requests it be removed. Crying in triage. Bleeding minimal.

## 2017-04-22 NOTE — Discharge Instructions (Signed)
Your fingernail was partially removed where it was broken.  The acrylic nail was removed intact.  There does not appear to be any laceration to the nail bed.  The dressing has been placed on the finger.  Please leave this in place for the next 24 hours on Friday, soak the finger in warm water until the dressing can be removed and then you can safely apply just a Band-Aid to keep the area clean and dry until the nail grows in

## 2017-04-22 NOTE — ED Provider Notes (Signed)
[facility]-EMERGENCY DEPT Provider Note   CSN: [id number] Arrival date & time: 04/22/17  0010     History   Chief Complaint Chief Complaint  Patient presents with  . Finger Injury    HPI Taylor Reed is a 21 y.o. female.  Patient has acrylic nails with a decorative bowl on the left ring finger.  She caught this below on a piece of material and partially avulsed.  The fingernail.  She now requesting the rest of the fingernail removed      Past Medical History:  Diagnosis Date  . Depression   . DUB (dysfunctional uterine bleeding)   . GC (gonococcus infection) 07/09/2014  . Seasonal allergies   . UTI (urinary tract infection) 07/09/2014    Patient Active Problem List   Diagnosis Date Noted  . 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 Acne 07/02/2015  . Surveillance for Depo-Provera contraception 05/10/2015  . Herpes genitalia 11/08/2014    No past surgical history on file.  OB History    No data available       Home Medications    Prior to Admission medications   Medication Sig Start Date End Date Taking? Authorizing Provider  acyclovir (ZOVIRAX) 400 MG tablet Take 400 mg three times daily for 5 days if lesions recur. Patient not taking: Reported on 06/16/2016 05/10/15   Christianne DolinMillican, Christy, NP  cetirizine (ZYRTEC) 10 MG tablet Take 1 tablet (10 mg total) by mouth daily. Patient not taking: Reported on 06/16/2016 04/10/14   Burnard HawthornePaul, Melinda C, MD  tretinoin (RETIN-A) 0.1 % cream Apply topically at bedtime. Patient not taking: Reported on 06/16/2016 07/02/15   Burnard HawthornePaul, Melinda C, MD  trimethoprim-polymyxin b Joaquim Lai(POLYTRIM) ophthalmic solution Place 1 drop into both eyes every 6 (six) hours. 04/06/17   Rockney Gheearnell, Elizabeth, MD    Family History No family history on file.  Social History Social History  Substance Use Topics  . Smoking status: Current Every Day Smoker    Types: Cigarettes  . Smokeless tobacco: Never Used  . Alcohol use Yes     Allergies   Latex   Review of  Systems Review of Systems  Skin: Positive for wound.  All other systems reviewed and are negative.    Physical Exam Updated Vital Signs BP 128/78 (BP Location: Right Arm)   Pulse (!) 121   Temp 99 F (37.2 C) (Oral)   Resp (!) 22 Comment: tearful  LMP  (LMP Unknown)   SpO2 96%   Physical Exam  Constitutional: She appears well-developed and well-nourished.  HENT:  Head: Normocephalic.  Eyes: Pupils are equal, round, and reactive to light.  Neck: Normal range of motion.  Cardiovascular: Normal rate.   Pulmonary/Chest: Effort normal.  Neurological: She is alert.  Skin: Skin is warm. No erythema.  Psychiatric: She has a normal mood and affect.  Vitals reviewed.    ED Treatments / Results  Labs (all labs ordered are listed, but only abnormal results are displayed) Labs Reviewed - No data to display  EKG  EKG Interpretation None       Radiology No results found.  Procedures .Nail Removal Date/Time: 04/22/2017 2:32 AM Performed by: Earley FavorSCHULZ, Leighanne Adolph Authorized by: Earley FavorSCHULZ, Makynlie Rossini   Consent:    Consent obtained:  Verbal   Consent given by:  Patient   Risks discussed:  Bleeding   Alternatives discussed:  No treatment Location:    Hand:  L ring finger Pre-procedure details:    Skin preparation:  ChloraPrep   Preparation: Patient was prepped  and draped in the usual sterile fashion   Anesthesia (see MAR for exact dosages):    Anesthesia method:  Nerve block   Block anesthetic:  Bupivacaine 0.5% w/o epi and lidocaine 1% w/o epi   Block technique:  Digital   Block injection procedure:  Anatomic landmarks identified, introduced needle, incremental injection, negative aspiration for blood and anatomic landmarks palpated   Block outcome:  Anesthesia achieved Nail Removal:    Nail removed:  Partial   Nail removed location: distal tip.   Nail bed repaired: no     Removed nail replaced and anchored: no   Trephination:    Subungual hematoma drained: no   Ingrown nail:     Wedge excision of skin: no     Nail matrix removed or ablated:  None Post-procedure details:    Dressing:  Xeroform gauze   Patient tolerance of procedure:  Tolerated well, no immediate complications   (including critical care time)  Medications Ordered in ED Medications  lidocaine (PF) (XYLOCAINE) 1 % injection 5 mL (not administered)  bupivacaine (MARCAINE) 0.5 % injection 10 mL (not administered)     Initial Impression / Assessment and Plan / ED Course  I have reviewed the triage vital signs and the nursing notes.  Pertinent labs & imaging results that were available during my care of the patient were reviewed by me and considered in my medical decision making (see chart for details).     Examination of the nail.  During the procedure.  It appears that she broke the nail in the bottom third with the remaining nail within the cuticle.  The distal two thirds were removed.  No nail bed repair and required at this time.  Xeroform dressing was applied   Final Clinical Impressions(s) / ED Diagnoses   Final diagnoses:  Nail avulsion, finger, initial encounter    New Prescriptions New Prescriptions   No medications on file     Earley Favor, NP 04/22/17 Lilli Few, MD 04/22/17 [phone]

## 2017-07-07 ENCOUNTER — Ambulatory Visit (INDEPENDENT_AMBULATORY_CARE_PROVIDER_SITE_OTHER): Payer: Self-pay

## 2017-07-07 DIAGNOSIS — Z3049 Encounter for surveillance of other contraceptives: Secondary | ICD-10-CM

## 2017-07-07 DIAGNOSIS — Z3202 Encounter for pregnancy test, result negative: Secondary | ICD-10-CM

## 2017-07-07 DIAGNOSIS — Z3042 Encounter for surveillance of injectable contraceptive: Secondary | ICD-10-CM

## 2017-07-07 LAB — POCT URINE PREGNANCY: PREG TEST UR: NEGATIVE

## 2017-07-07 MED ORDER — MEDROXYPROGESTERONE ACETATE 150 MG/ML IM SUSP
150.0000 mg | Freq: Once | INTRAMUSCULAR | Status: AC
  Administered 2017-07-07: 150 mg via INTRAMUSCULAR

## 2017-07-08 NOTE — Progress Notes (Signed)
Pt presents for depo injection. Pt not within depo window, urine hcg negative. Injection given, tolerated well. F/u depo injection visit scheduled.   

## 2017-09-22 ENCOUNTER — Ambulatory Visit: Payer: Self-pay

## 2017-09-23 ENCOUNTER — Ambulatory Visit: Payer: Self-pay

## 2017-11-26 ENCOUNTER — Ambulatory Visit: Payer: Self-pay | Admitting: Family

## 2017-11-29 ENCOUNTER — Ambulatory Visit: Payer: Self-pay | Admitting: Pediatrics

## 2017-12-27 ENCOUNTER — Emergency Department (HOSPITAL_COMMUNITY)
Admission: EM | Admit: 2017-12-27 | Discharge: 2017-12-27 | Disposition: A | Discharge status: Home / Self Care | Payer: Self-pay | Attending: Emergency Medicine | Admitting: Emergency Medicine

## 2017-12-27 ENCOUNTER — Other Ambulatory Visit: Payer: Self-pay

## 2017-12-27 ENCOUNTER — Encounter (HOSPITAL_COMMUNITY): Payer: Self-pay

## 2017-12-27 DIAGNOSIS — F17210 Nicotine dependence, cigarettes, uncomplicated: Secondary | ICD-10-CM | POA: Insufficient documentation

## 2017-12-27 DIAGNOSIS — L0291 Cutaneous abscess, unspecified: Secondary | ICD-10-CM

## 2017-12-27 DIAGNOSIS — L02411 Cutaneous abscess of right axilla: Secondary | ICD-10-CM | POA: Insufficient documentation

## 2017-12-27 MED ORDER — OXYCODONE-ACETAMINOPHEN 5-325 MG PO TABS
1.0000 | ORAL_TABLET | ORAL | Status: DC | PRN
  Administered 2017-12-27: 1 via ORAL
  Filled 2017-12-27: qty 1

## 2017-12-27 MED ORDER — SULFAMETHOXAZOLE-TRIMETHOPRIM 800-160 MG PO TABS: 1.0000 | ORAL_TABLET | Freq: Two times a day (BID) | ORAL | 0 refills | Status: AC

## 2017-12-27 MED ORDER — LIDOCAINE-EPINEPHRINE (PF) 2 %-1:200000 IJ SOLN
20.0000 mL | Freq: Once | INTRAMUSCULAR | Status: DC
  Filled 2017-12-27: qty 20

## 2017-12-27 NOTE — Discharge Instructions (Signed)
Please read attached information. If you experience any new or worsening signs or symptoms please return to the emergency room for evaluation. Please follow-up with your primary care provider or specialist as discussed. Please use medication prescribed only as directed and discontinue taking if you have any concerning signs or symptoms.   °

## 2017-12-27 NOTE — ED Triage Notes (Signed)
Pt states she has had an abscess in her right axilla X4 days. She reports the area has gotten more swollen,.  Several bumps noted. Swelling, and drainage noted.

## 2017-12-27 NOTE — ED Provider Notes (Signed)
[facility] Provider Note   CSN: [id number] Arrival date & time: 12/27/17  0912     History   Chief Complaint Chief Complaint  Patient presents with  . Abscess    HPI Taylor Reed is a 22 y.o. female.  HPI   22 year old female presents today with abscess to her right axilla.  She notes 5-day history of worsening abscess.  No discharge, no fever, history of the same.  Patient reports using warm compress at home with no resolution of her symptoms.  History diabetes or significant skin infections.  Patient believes this is caused by her deodorant.  Past Medical History:  Diagnosis Date  . Depression   . DUB (dysfunctional uterine bleeding)   . GC (gonococcus infection) 07/09/2014  . Seasonal allergies   . UTI (urinary tract infection) 07/09/2014    Patient Active Problem List   Diagnosis Date Noted  . 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 Acne 07/02/2015  . Surveillance for Depo-Provera contraception 05/10/2015  . Herpes genitalia 11/08/2014    History reviewed. No pertinent surgical history.  OB History    No data available       Home Medications    Prior to Admission medications   Medication Sig Start Date End Date Taking? Authorizing Provider  medroxyPROGESTERone (DEPO-PROVERA) 150 MG/ML injection Inject 150 mg into the muscle every 3 (three) months.   Yes [provider]  acyclovir (ZOVIRAX) 400 MG tablet Take 400 mg three times daily for 5 days if lesions recur. Patient not taking: Reported on 06/16/2016 05/10/15   Christianne DolinMillican, Christy, NP  cetirizine (ZYRTEC) 10 MG tablet Take 1 tablet (10 mg total) by mouth daily. Patient not taking: Reported on 06/16/2016 04/10/14   Burnard HawthornePaul, Melinda C, MD  sulfamethoxazole-trimethoprim (BACTRIM DS,SEPTRA DS) 800-160 MG tablet Take 1 tablet by mouth 2 (two) times daily for 7 days. 12/27/17 01/03/18  Kiylee Thoreson, Tinnie GensJeffrey, PA-C  tretinoin (RETIN-A) 0.1 % cream Apply topically at bedtime. Patient not  taking: Reported on 06/16/2016 07/02/15   Burnard HawthornePaul, Melinda C, MD    Family History History reviewed. No pertinent family history.  Social History Social History   Tobacco Use  . Smoking status: Current Every Day Smoker    Types: Cigarettes  . Smokeless tobacco: Never Used  Substance Use Topics  . Alcohol use: Yes  . Drug use: Yes     Allergies   Patient has no known allergies.   Review of Systems Review of Systems  All other systems reviewed and are negative.    Physical Exam Updated Vital Signs BP 115/69 (BP Location: Right Arm)   Pulse 90   Temp 98.6 F (37 C) (Oral)   Resp 20   SpO2 98%   Physical Exam  Constitutional: She is oriented to person, place, and time. She appears well-developed and well-nourished.  HENT:  Head: Normocephalic and atraumatic.  Eyes: Conjunctivae are normal. Pupils are equal, round, and reactive to light. Right eye exhibits no discharge. Left eye exhibits no discharge. No scleral icterus.  Neck: Normal range of motion. No JVD present. No tracheal deviation present.  Pulmonary/Chest: Effort normal. No stridor.  Musculoskeletal:  3 cm area of induration and redness to the right axilla no discharge noted - several areas of scar tissue   Neurological: She is alert and oriented to person, place, and time. Coordination normal.  Psychiatric: She has a normal mood and affect. Her behavior is normal. Judgment and thought content normal.  Nursing note and vitals  reviewed.   ED Treatments / Results  Labs (all labs ordered are listed, but only abnormal results are displayed) Labs Reviewed - No data to display  EKG  EKG Interpretation None       Radiology No results found.  Procedures .Marland KitchenIncision and Drainage Date/Time: 12/27/2017 1:56 PM Performed by: Eyvonne Mechanic, PA-C Authorized by: Eyvonne Mechanic, PA-C   Consent:    Consent obtained:  Verbal   Consent given by:  Patient   Risks discussed:  Bleeding and incomplete drainage    Alternatives discussed:  No treatment, delayed treatment and alternative treatment Location:    Type:  Abscess   Size:  3 cm    Location: right axilla  Pre-procedure details:    Procedure prep: alcohol prep. Anesthesia (see MAR for exact dosages):    Anesthesia method:  Local infiltration   Local anesthetic:  Lidocaine 2% w/o epi Procedure type:    Complexity:  Simple Procedure details:    Needle aspiration: no     Incision types:  Stab incision   Incision depth:  Dermal   Scalpel blade:  11   Wound management:  Probed and deloculated   Drainage:  Purulent   Drainage amount:  Copious   Wound treatment:  Wound left open   Packing materials:  1/4 in gauze Post-procedure details:    Patient tolerance of procedure:  Tolerated well, no immediate complications   (including critical care time)  Medications Ordered in ED Medications  oxyCODONE-acetaminophen (PERCOCET/ROXICET) 5-325 MG per tablet 1 tablet (1 tablet Oral Given 12/27/17 0938)  lidocaine-EPINEPHrine (XYLOCAINE W/EPI) 2 %-1:200000 (PF) injection 20 mL (not administered)     Initial Impression / Assessment and Plan / ED Course  I have reviewed the triage vital signs and the nursing notes.  Pertinent labs & imaging results that were available during my care of the patient were reviewed by me and considered in my medical decision making (see chart for details).      Final Clinical Impressions(s) / ED Diagnoses   Final diagnoses:  Abscess   Labs:   Imaging:  Consults:  Therapeutics:  Discharge Meds:   Assessment/Plan: 22 year old female presents today with abscess to right axilla.  Minor overlying redness.  I&D successful.  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  She referred to general surgery wound care instructions given, strict return precautions given.  Patient verbalized understanding and agreement to today's plan.   ED Discharge Orders        Ordered    sulfamethoxazole-trimethoprim (BACTRIM  DS,SEPTRA DS) 800-160 MG tablet  2 times daily     12/27/17 1358       Eyvonne Mechanic, PA-C 12/27/17 1358    Azalia Bilis, MD 12/27/17 1450

## 2018-10-19 NOTE — L&D Delivery Note (Signed)
Delivery Note At 39 a viable female infant was delivered via SVD, presentation: OA. APGAR: 8, 9; weight pending.   Placenta status: spontaneously delivered intact with gentle cord traction. Fundus firm with massage and Pitocin.   Anesthesia: epidural Lacerations: periurethral-hemostatic Est. Blood Loss (mL): 60 Placenta to LD Complications nuchal X8-PJASN, reduced after somersault maneuver; compound left hand Cord ph n/a   Mom to postpartum. Baby to Couplet care / Skin to Skin.    Julianne Handler, CNM 05/04/2019 11:22 AM

## 2018-10-29 ENCOUNTER — Inpatient Hospital Stay (HOSPITAL_COMMUNITY)
Admission: AD | Admit: 2018-10-29 | Discharge: 2018-10-29 | Disposition: A | Discharge status: Home / Self Care | Payer: Medicaid Other | Attending: Family Medicine | Admitting: Family Medicine

## 2018-10-29 ENCOUNTER — Encounter (HOSPITAL_COMMUNITY): Payer: Self-pay

## 2018-10-29 DIAGNOSIS — O99331 Smoking (tobacco) complicating pregnancy, first trimester: Secondary | ICD-10-CM | POA: Insufficient documentation

## 2018-10-29 DIAGNOSIS — F17210 Nicotine dependence, cigarettes, uncomplicated: Secondary | ICD-10-CM | POA: Insufficient documentation

## 2018-10-29 DIAGNOSIS — Z3A10 10 weeks gestation of pregnancy: Secondary | ICD-10-CM

## 2018-10-29 DIAGNOSIS — O26891 Other specified pregnancy related conditions, first trimester: Secondary | ICD-10-CM | POA: Diagnosis not present

## 2018-10-29 DIAGNOSIS — O210 Mild hyperemesis gravidarum: Secondary | ICD-10-CM | POA: Insufficient documentation

## 2018-10-29 DIAGNOSIS — O219 Vomiting of pregnancy, unspecified: Secondary | ICD-10-CM

## 2018-10-29 LAB — URINALYSIS, ROUTINE W REFLEX MICROSCOPIC
Bilirubin Urine: NEGATIVE
GLUCOSE, UA: NEGATIVE mg/dL
Hgb urine dipstick: NEGATIVE
KETONES UR: NEGATIVE mg/dL
LEUKOCYTES UA: NEGATIVE
NITRITE: NEGATIVE
PH: 6 (ref 5.0–8.0)
Protein, ur: NEGATIVE mg/dL
SPECIFIC GRAVITY, URINE: 1.026 (ref 1.005–1.030)

## 2018-10-29 LAB — POCT PREGNANCY, URINE: Preg Test, Ur: POSITIVE — AB

## 2018-10-29 MED ORDER — METOCLOPRAMIDE HCL 10 MG PO TABS: 10.0000 mg | ORAL_TABLET | Freq: Three times a day (TID) | ORAL | 0 refills | Status: DC | PRN

## 2018-10-29 NOTE — MAU Note (Signed)
States she always feels sick and has trouble keeping down food/liquid  This AM after vomiting when she stood up she felt dizzy  Emesis x3 today  No diarrhea  No pain, bleeding, or discharge  Has not started [facility] yet

## 2018-10-29 NOTE — Discharge Instructions (Signed)
Morning Sickness  Morning sickness is when a woman feels nauseous during pregnancy. This nauseous feeling may or may not come with vomiting. It often occurs in the morning, but it can be a problem at any time of day. Morning sickness is most common during the first trimester. In some cases, it may continue throughout pregnancy. Although morning sickness is unpleasant, it is usually harmless unless the woman develops severe and continual vomiting (hyperemesis gravidarum), a condition that requires more intense treatment. What are the causes? The exact cause of this condition is not known, but it seems to be related to normal hormonal changes that occur in pregnancy. What increases the risk? You are more likely to develop this condition if:  You experienced nausea or vomiting before your pregnancy.  You had morning sickness during a previous pregnancy.  You are pregnant with more than one baby, such as twins. What are the signs or symptoms? Symptoms of this condition include:  Nausea.  Vomiting. How is this diagnosed? This condition is usually diagnosed based on your signs and symptoms. How is this treated? In many cases, treatment is not needed for this condition. Making some changes to what you eat may help to control symptoms. Your health care provider may also prescribe or recommend:  Vitamin B6 supplements.  Anti-nausea medicines.  Ginger. Follow these instructions at home: Medicines  Take over-the-counter and prescription medicines only as told by your health care provider. Do not use any prescription, over-the-counter, or herbal medicines for morning sickness without first talking with your health care provider.  Taking multivitamins before getting pregnant can prevent or decrease the severity of morning sickness in most women. Eating and drinking  Eat a piece of dry toast or crackers before getting out of bed in the morning.  Eat 5 or 6 small meals a day.  Eat dry and  bland foods, such as rice or a baked potato. Foods that are high in carbohydrates are often helpful.  Avoid greasy, fatty, and spicy foods.  Have someone cook for you if the smell of any food causes nausea and vomiting.  If you feel nauseous after taking prenatal vitamins, take the vitamins at night or with a snack.  Snack on protein foods between meals if you are hungry. Nuts, yogurt, and cheese are good options.  Drink fluids throughout the day.  Try ginger ale made with real ginger, ginger tea made from fresh grated ginger, or ginger candies. General instructions  Do not use any products that contain nicotine or tobacco, such as cigarettes and e-cigarettes. If you need help quitting, ask your health care provider.  Get an air purifier to keep the air in your house free of odors.  Get plenty of fresh air.  Try to avoid odors that trigger your nausea.  Consider trying these methods to help relieve symptoms: ? Wearing an acupressure wristband. These wristbands are often worn for seasickness. ? Acupuncture. Contact a health care provider if:  Your home remedies are not working and you need medicine.  You feel dizzy or light-headed.  You are losing weight. Get help right away if:  You have persistent and uncontrolled nausea and vomiting.  You faint.  You have severe pain in your abdomen. Summary  Morning sickness is when a woman feels nauseous during pregnancy. This nauseous feeling may or may not come with vomiting.  Morning sickness is most common during the first trimester.  It often occurs in the morning, but it can be a problem at  any time of day. °· In many cases, treatment is not needed for this condition. Making some changes to what you eat may help to control symptoms. °This information is not intended to replace advice given to you by your health care provider. Make sure you discuss any questions you have with your health care provider. °Document Released:  11/26/2006 Document Revised: 11/07/2016 Document Reviewed: 11/07/2016 °Elsevier Interactive Patient Education © 2019 Elsevier Inc[employer] ° ° ° ° ° °[facility] ° ° °[facility]       Phone: [phone] ° °[facility] Phone: [phone] ° °[facility] [place]  Phone: [phone] ° °[facility] [place]  Phone: [phone] ° °[facility] [place]  Phone: [phone] ° °[facility]       Phone: [phone] ° °[facility]    Phone: [phone]  ° °[facility]    Phone: [phone] ° °[facility]    Phone: [phone] ° °[facility]    Phone: [phone]  ° °[facility]  Phone: [phone] ° °[facility]    Phone: [phone] ° °[facility]   Phone: [phone] ° °[facility]    Phone: [phone] ° °

## 2018-10-29 NOTE — MAU Provider Note (Signed)
Chief Complaint: Nausea; Emesis; and Dizziness   First Provider Initiated Contact with Patient 10/29/18 0842     SUBJECTIVE HPI: Taylor Reed is a 23 y.o. G1P0 at 4211w3d who presents to Maternity Admissions reporting n/v. Has had n/v throughout the pregnancy but it has worsened in the last 3 days. Has vomited 4 times since last night. Does not have antiemetic at home. States this morning after vomiting she got dizzy & felt like she was going to pass out but did not lose consciousness.  Has not started prenatal care yet d/t insurance pending.  Denies abdominal pain, vaginal bleeding, fever, diarrhea.   Past Medical History:  Diagnosis Date  . Depression   . DUB (dysfunctional uterine bleeding)   . GC (gonococcus infection) 07/09/2014  . Seasonal allergies   . UTI (urinary tract infection) 07/09/2014   OB History  Gravida Para Term Preterm AB Living  1            SAB TAB Ectopic Multiple Live Births               # Outcome Date GA Lbr Len/2nd Weight Sex Delivery Anes PTL Lv  1 Current            History reviewed. No pertinent surgical history. Social History   Socioeconomic History  . Marital status: Single    Spouse name: Not on file  . Number of children: Not on file  . Years of education: Not on file  . Highest education level: Not on file  Occupational History  . Not on file  Social Needs  . Financial resource strain: Not on file  . Food insecurity:    Worry: Not on file    Inability: Not on file  . Transportation needs:    Medical: Not on file    Non-medical: Not on file  Tobacco Use  . Smoking status: Current Every Day Smoker    Types: Cigarettes  . Smokeless tobacco: Never Used  . Tobacco comment: none since dec 2019  Substance and Sexual Activity  . Alcohol use: Not Currently    Comment: none since + UPT  . Drug use: Not Currently  . Sexual activity: Never  Lifestyle  . Physical activity:    Days per week: Not on file    Minutes per session: Not on  file  . Stress: Not on file  Relationships  . Social connections:    Talks on phone: Not on file    Gets together: Not on file    Attends religious service: Not on file    Active member of club or organization: Not on file    Attends meetings of clubs or organizations: Not on file    Relationship status: Not on file  . Intimate partner violence:    Fear of current or ex partner: Not on file    Emotionally abused: Not on file    Physically abused: Not on file    Forced sexual activity: Not on file  Other Topics Concern  . Not on file  Social History Narrative  . Not on file   History reviewed. No pertinent family history. No current facility-administered medications on file prior to encounter.    No current outpatient medications on file prior to encounter.   Allergies  Allergen Reactions  . Latex Rash    I have reviewed patient's Past Medical Hx, Surgical Hx, Family Hx, Social Hx, medications and allergies.   Review of Systems  Constitutional: Negative.  Cardiovascular: Negative for palpitations.  Gastrointestinal: Positive for nausea and vomiting. Negative for abdominal pain.  Genitourinary: Negative.   Neurological: Positive for dizziness. Negative for syncope and headaches.    OBJECTIVE Patient Vitals for the past 24 hrs:  BP Temp Temp src Pulse Resp SpO2 Height Weight  10/29/18 0938 (!) 108/51 - - 85 - - - -  10/29/18 0920 - - - - - 99 % - -  10/29/18 0915 - - - - - 100 % - -  10/29/18 0905 - - - - - 100 % - -  10/29/18 0900 - - - - - 100 % - -  10/29/18 0855 - - - - - 100 % - -  10/29/18 0850 - - - - - 99 % - -  10/29/18 0845 - - - - - 100 % - -  10/29/18 0834 108/63 98.1 F (36.7 C) Oral 80 18 - - -  10/29/18 6244 - - - - - - 5\' 4"  (1.626 m) 98.9 kg   Constitutional: Well-developed, well-nourished female in no acute distress.  Cardiovascular: normal rate & rhythm, no murmur Respiratory: normal rate and effort. Lung sounds clear throughout GI: Abd soft,  non-tender, Pos BS x 4. No guarding or rebound tenderness MS: Extremities nontender, no edema, normal ROM Neurologic: Alert and oriented x 4.     LAB RESULTS Results for orders placed or performed during the hospital encounter of 10/29/18 (from the past 24 hour(s))  Urinalysis, Routine w reflex microscopic     Status: Abnormal   Collection Time: 10/29/18  8:23 AM  Result Value Ref Range   Color, Urine YELLOW YELLOW   APPearance HAZY (A) CLEAR   Specific Gravity, Urine 1.026 1.005 - 1.030   pH 6.0 5.0 - 8.0   Glucose, UA NEGATIVE NEGATIVE mg/dL   Hgb urine dipstick NEGATIVE NEGATIVE   Bilirubin Urine NEGATIVE NEGATIVE   Ketones, ur NEGATIVE NEGATIVE mg/dL   Protein, ur NEGATIVE NEGATIVE mg/dL   Nitrite NEGATIVE NEGATIVE   Leukocytes, UA NEGATIVE NEGATIVE  Pregnancy, urine POC     Status: Abnormal   Collection Time: 10/29/18  8:25 AM  Result Value Ref Range   Preg Test, Ur POSITIVE (A) NEGATIVE    IMAGING No results found.  MAU COURSE Orders Placed This Encounter  Procedures  . Urinalysis, Routine w reflex microscopic  . Pregnancy, urine POC  . Discharge patient   Meds ordered this encounter  Medications  . metoCLOPramide (REGLAN) 10 MG tablet    Sig: Take 1 tablet (10 mg total) by mouth every 8 (eight) hours as needed for nausea.    Dispense:  30 tablet    Refill:  0    Order Specific Question:   Supervising Provider    Answer:   Samara Snide    MDM FHT present via doppler Currently not nauseated & would like to try to drink water.  Pt tolerated water without vomiting. Will send home with rx for reglan ASSESSMENT 1. Nausea and vomiting during pregnancy prior to [redacted] weeks gestation   2. [redacted] weeks gestation of pregnancy     PLAN Discharge home in stable condition.  Allergies as of 10/29/2018      Reactions   Latex Rash      Medication List    STOP taking these medications   acyclovir 400 MG tablet Commonly known as:  ZOVIRAX   cetirizine 10 MG  tablet Commonly known as:  ZYRTEC   medroxyPROGESTERone 150 MG/ML  injection Commonly known as:  DEPO-PROVERA   tretinoin 0.1 % cream Commonly known as:  RETIN-A     TAKE these medications   metoCLOPramide 10 MG tablet Commonly known as:  REGLAN Take 1 tablet (10 mg total) by mouth every 8 (eight) hours as needed for nausea.        Judeth HornLawrence, Levi Crass, NP 10/29/2018  3:42 PM

## 2018-12-01 ENCOUNTER — Ambulatory Visit (INDEPENDENT_AMBULATORY_CARE_PROVIDER_SITE_OTHER): Payer: Medicaid Other | Admitting: Obstetrics and Gynecology

## 2018-12-01 ENCOUNTER — Encounter: Payer: Self-pay | Admitting: Obstetrics and Gynecology

## 2018-12-01 ENCOUNTER — Other Ambulatory Visit (HOSPITAL_COMMUNITY)
Admission: RE | Admit: 2018-12-01 | Discharge: 2018-12-01 | Disposition: A | Discharge status: Home / Self Care | Payer: Medicaid Other | Source: Ambulatory Visit | Attending: Obstetrics and Gynecology | Admitting: Obstetrics and Gynecology

## 2018-12-01 VITALS — BP 107/70 | HR 76 | Wt 226.8 lb

## 2018-12-01 DIAGNOSIS — O99212 Obesity complicating pregnancy, second trimester: Secondary | ICD-10-CM

## 2018-12-01 DIAGNOSIS — Z3482 Encounter for supervision of other normal pregnancy, second trimester: Secondary | ICD-10-CM | POA: Diagnosis not present

## 2018-12-01 DIAGNOSIS — Z3402 Encounter for supervision of normal first pregnancy, second trimester: Secondary | ICD-10-CM | POA: Insufficient documentation

## 2018-12-01 DIAGNOSIS — O99210 Obesity complicating pregnancy, unspecified trimester: Secondary | ICD-10-CM

## 2018-12-01 DIAGNOSIS — Z3A15 15 weeks gestation of pregnancy: Secondary | ICD-10-CM

## 2018-12-01 DIAGNOSIS — Z3403 Encounter for supervision of normal first pregnancy, third trimester: Secondary | ICD-10-CM

## 2018-12-01 HISTORY — DX: Encounter for supervision of normal first pregnancy, third trimester: Z34.03

## 2018-12-01 MED ORDER — VITAFOL GUMMIES 3.33-0.333-34.8 MG PO CHEW: 2.0000 | CHEWABLE_TABLET | Freq: Every day | ORAL | 6 refills | Status: AC

## 2018-12-01 MED ORDER — ASPIRIN EC 81 MG PO TBEC: 81.0000 mg | DELAYED_RELEASE_TABLET | Freq: Every day | ORAL | 2 refills | Status: DC

## 2018-12-01 MED ORDER — DOXYLAMINE-PYRIDOXINE 10-10 MG PO TBEC: 2.0000 | DELAYED_RELEASE_TABLET | Freq: Every day | ORAL | 5 refills | Status: DC

## 2018-12-01 MED ORDER — FAMOTIDINE 20 MG PO TABS: 20.0000 mg | ORAL_TABLET | Freq: Two times a day (BID) | ORAL | 3 refills | Status: DC

## 2018-12-01 NOTE — Progress Notes (Signed)
Pt is here for initial OB visit. EDD 05/24/19 based on LMP.

## 2018-12-01 NOTE — Progress Notes (Signed)
  Subjective:    Taylor Reed is a [id number] being seen today for her first obstetrical visit.  Her obstetrical history is significant for first pregnancy. Patient does intend to breast feed. Pregnancy history fully reviewed.  Patient reports nausea.  Vitals:   12/01/18 1402  BP: 107/70  Pulse: 76  Weight: 226 lb 12.8 oz (102.9 kg)    HISTORY: OB History  Gravida Para Term Preterm AB Living  1            SAB TAB Ectopic Multiple Live Births               # Outcome Date GA Lbr Len/2nd Weight Sex Delivery Anes PTL Lv  1 Current            Past Medical History:  Diagnosis Date  . Depression   . DUB (dysfunctional uterine bleeding)   . GC (gonococcus infection) 07/09/2014  . Seasonal allergies   . UTI (urinary tract infection) 07/09/2014   History reviewed. No pertinent surgical history. Family History  Problem Relation Age of Onset  . Cancer Mother      Exam    Uterus:     Pelvic Exam:    Perineum: No Hemorrhoids, Normal Perineum   Vulva: normal   Vagina:  normal mucosa, normal discharge   pH:    Cervix: nulliparous appearance and cervix is closed and long   Adnexa: normal adnexa and no mass, fullness, tenderness   Bony Pelvis: gynecoid  System: Breast:  normal appearance, no masses or tenderness   Skin: normal coloration and turgor, no rashes    Neurologic: oriented, no focal deficits   Extremities: normal strength, tone, and muscle mass   HEENT extra ocular movement intact   Mouth/Teeth mucous membranes moist, pharynx normal without lesions and dental hygiene good   Neck supple and no masses   Cardiovascular: regular rate and rhythm   Respiratory:  appears well, vitals normal, no respiratory distress, acyanotic, normal RR, chest clear, no wheezing, crepitations, rhonchi, normal symmetric air entry   Abdomen: soft, non-tender; bowel sounds normal; no masses,  no organomegaly   Urinary:       Assessment:    Pregnancy: G1P0 Patient Active Problem  List   Diagnosis Date Noted  . Encounter for supervision of normal first pregnancy in second trimester 12/01/2018  . 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 Acne 07/02/2015  . Surveillance for Depo-Provera contraception 05/10/2015  . Herpes genitalia 11/08/2014        Plan:     Initial labs drawn. Prenatal vitamins. Problem list reviewed and updated. Genetic Screening discussed : panorama ordered.  Ultrasound discussed; fetal survey: ordered.  Follow up in 4 weeks. 50% of 30 min visit spent on counseling and coordination of care.     Taylor Reed 12/01/2018

## 2018-12-01 NOTE — Patient Instructions (Signed)
° °Second Trimester of Pregnancy °The second trimester is from week 14 through week 27 (months 4 through 6). The second trimester is often a time when you feel your best. Your body has adjusted to being pregnant, and you begin to feel better physically. Usually, morning sickness has lessened or quit completely, you may have more energy, and you may have an increase in appetite. The second trimester is also a time when the fetus is growing rapidly. At the end of the sixth month, the fetus is about 9 inches long and weighs about 1½ pounds. You will likely begin to feel the baby move (quickening) between 16 and 20 weeks of pregnancy. °Body changes during your second trimester °Your body continues to go through many changes during your second trimester. The changes vary from woman to woman. °· Your weight will continue to increase. You will notice your lower abdomen bulging out. °· You may begin to get stretch marks on your hips, abdomen, and breasts. °· You may develop headaches that can be relieved by medicines. The medicines should be approved by your health care provider. °· You may urinate more often because the fetus is pressing on your bladder. °· You may develop or continue to have heartburn as a result of your pregnancy. °· You may develop constipation because certain hormones are causing the muscles that push waste through your intestines to slow down. °· You may develop hemorrhoids or swollen, bulging veins (varicose veins). °· You may have back pain. This is caused by: °? Weight gain. °? Pregnancy hormones that are relaxing the joints in your pelvis. °? A shift in weight and the muscles that support your balance. °· Your breasts will continue to grow and they will continue to become tender. °· Your gums may bleed and may be sensitive to brushing and flossing. °· Dark spots or blotches (chloasma, mask of pregnancy) may develop on your face. This will likely fade after the baby is born. °· A dark line from  your belly button to the pubic area (linea nigra) may appear. This will likely fade after the baby is born. °· You may have changes in your hair. These can include thickening of your hair, rapid growth, and changes in texture. Some women also have hair loss during or after pregnancy, or hair that feels dry or thin. Your hair will most likely return to normal after your baby is born. °What to expect at prenatal visits °During a routine prenatal visit: °· You will be weighed to make sure you and the fetus are growing normally. °· Your blood pressure will be taken. °· Your abdomen will be measured to track your baby's growth. °· The fetal heartbeat will be listened to. °· Any test results from the previous visit will be discussed. °Your health care provider may ask you: °· How you are feeling. °· If you are feeling the baby move. °· If you have had any abnormal symptoms, such as leaking fluid, bleeding, severe headaches, or abdominal cramping. °· If you are using any tobacco products, including cigarettes, chewing tobacco, and electronic cigarettes. °· If you have any questions. °Other tests that may be performed during your second trimester include: °· Blood tests that check for: °? Low iron levels (anemia). °? High blood sugar that affects pregnant women (gestational diabetes) between 24 and 28 weeks. °? Rh antibodies. This is to check for a protein on red blood cells (Rh factor). °· Urine tests to check for infections, diabetes, or protein in   the urine. °· An ultrasound to confirm the proper growth and development of the baby. °· An amniocentesis to check for possible genetic problems. °· Fetal screens for spina bifida and Down syndrome. °· HIV (human immunodeficiency virus) testing. Routine prenatal testing includes screening for HIV, unless you choose not to have this test. °Follow these instructions at home: °Medicines °· Follow your health care provider's instructions regarding medicine use. Specific medicines  may be either safe or unsafe to take during pregnancy. °· Take a prenatal vitamin that contains at least 600 micrograms (mcg) of folic acid. °· If you develop constipation, try taking a stool softener if your health care provider approves. °Eating and drinking ° °· Eat a balanced diet that includes fresh fruits and vegetables, whole grains, good sources of protein such as meat, eggs, or tofu, and low-fat dairy. Your health care provider will help you determine the amount of weight gain that is right for you. °· Avoid raw meat and uncooked cheese. These carry germs that can cause birth defects in the baby. °· If you have low calcium intake from food, talk to your health care provider about whether you should take a daily calcium supplement. °· Limit foods that are high in fat and processed sugars, such as fried and sweet foods. °· To prevent constipation: °? Drink enough fluid to keep your urine clear or pale yellow. °? Eat foods that are high in fiber, such as fresh fruits and vegetables, whole grains, and beans. °Activity °· Exercise only as directed by your health care provider. Most women can continue their usual exercise routine during pregnancy. Try to exercise for 30 minutes at least 5 days a week. Stop exercising if you experience uterine contractions. °· Avoid heavy lifting, wear low heel shoes, and practice good posture. °· A sexual relationship may be continued unless your health care provider directs you otherwise. °Relieving pain and discomfort °· Wear a good support bra to prevent discomfort from breast tenderness. °· Take warm sitz baths to soothe any pain or discomfort caused by hemorrhoids. Use hemorrhoid cream if your health care provider approves. °· Rest with your legs elevated if you have leg cramps or low back pain. °· If you develop varicose veins, wear support hose. Elevate your feet for 15 minutes, 3-4 times a day. Limit salt in your diet. °Prenatal Care °· Write down your questions. Take  them to your prenatal visits. °· Keep all your prenatal visits as told by your health care provider. This is important. °Safety °· Wear your seat belt at all times when driving. °· Make a list of emergency phone numbers, including numbers for family, friends, the hospital, and police and fire departments. °General instructions °· Ask your health care provider for a referral to a local prenatal education class. Begin classes no later than the beginning of month 6 of your pregnancy. °· Ask for help if you have counseling or nutritional needs during pregnancy. Your health care provider can offer advice or refer you to specialists for help with various needs. °· Do not use hot tubs, steam rooms, or saunas. °· Do not douche or use tampons or scented sanitary pads. °· Do not cross your legs for long periods of time. °· Avoid cat litter boxes and soil used by cats. These carry germs that can cause birth defects in the baby and possibly loss of the fetus by miscarriage or stillbirth. °· Avoid all smoking, herbs, alcohol, and unprescribed drugs. Chemicals in these products can affect the   formation and growth of the baby. °· Do not use any products that contain nicotine or tobacco, such as cigarettes and e-cigarettes. If you need help quitting, ask your health care provider. °· Visit your dentist if you have not gone yet during your pregnancy. Use a soft toothbrush to brush your teeth and be gentle when you floss. °Contact a health care provider if: °· You have dizziness. °· You have mild pelvic cramps, pelvic pressure, or nagging pain in the abdominal area. °· You have persistent nausea, vomiting, or diarrhea. °· You have a bad smelling vaginal discharge. °· You have pain when you urinate. °Get help right away if: °· You have a fever. °· You are leaking fluid from your vagina. °· You have spotting or bleeding from your vagina. °· You have severe abdominal cramping or pain. °· You have rapid weight gain or weight loss. °· You  have shortness of breath with chest pain. °· You notice sudden or extreme swelling of your face, hands, ankles, feet, or legs. °· You have not felt your baby move in over an hour. °· You have severe headaches that do not go away when you take medicine. °· You have vision changes. °Summary °· The second trimester is from week 14 through week 27 (months 4 through 6). It is also a time when the fetus is growing rapidly. °· Your body goes through many changes during pregnancy. The changes vary from woman to woman. °· Avoid all smoking, herbs, alcohol, and unprescribed drugs. These chemicals affect the formation and growth your baby. °· Do not use any tobacco products, such as cigarettes, chewing tobacco, and e-cigarettes. If you need help quitting, ask your health care provider. °· Contact your health care provider if you have any questions. Keep all prenatal visits as told by your health care provider. This is important. °This information is not intended to replace advice given to you by your health care provider. Make sure you discuss any questions you have with your health care provider. °Document Released: 09/29/2001 Document Revised: 11/10/2016 Document Reviewed: 11/10/2016 °[employer] Interactive Patient Education © 2019 [employer] ° ° °Contraception Choices °Contraception, also called birth control, refers to methods or devices that prevent pregnancy. °Hormonal methods °Contraceptive implant ° °A contraceptive implant is a thin, plastic tube that contains a hormone. It is inserted into the upper part of the arm. It can remain in place for up to 3 years. °Progestin-only injections °Progestin-only injections are injections of progestin, a synthetic form of the hormone progesterone. They are given every 3 months by a health care provider. °Birth control pills ° °Birth control pills are pills that contain hormones that prevent pregnancy. They must be taken once a day, preferably at the same time each day. °Birth  control patch ° °The birth control patch contains hormones that prevent pregnancy. It is placed on the skin and must be changed once a week for three weeks and removed on the fourth week. A prescription is needed to use this method of contraception. °Vaginal ring ° °A vaginal ring contains hormones that prevent pregnancy. It is placed in the vagina for three weeks and removed on the fourth week. After that, the process is repeated with a new ring. A prescription is needed to use this method of contraception. °Emergency contraceptive °Emergency contraceptives prevent pregnancy after unprotected sex. They come in pill form and can be taken up to 5 days after sex. They work best the sooner they are taken after having sex. Most   emergency contraceptives are available without a prescription. This method should not be used as your only form of birth control. °Barrier methods °Female condom ° °A female condom is a thin sheath that is worn over the penis during sex. Condoms keep sperm from going inside a woman's body. They can be used with a spermicide to increase their effectiveness. They should be disposed after a single use. °Female condom ° °A female condom is a soft, loose-fitting sheath that is put into the vagina before sex. The condom keeps sperm from going inside a woman's body. They should be disposed after a single use. °Diaphragm ° °A diaphragm is a soft, dome-shaped barrier. It is inserted into the vagina before sex, along with a spermicide. The diaphragm blocks sperm from entering the uterus, and the spermicide kills sperm. A diaphragm should be left in the vagina for 6-8 hours after sex and removed within 24 hours. °A diaphragm is prescribed and fitted by a health care provider. A diaphragm should be replaced every 1-2 years, after giving birth, after gaining more than 15 lb (6.8 kg), and after pelvic surgery. °Cervical cap ° °A cervical cap is a round, soft latex or plastic cup that fits over the cervix. It is  inserted into the vagina before sex, along with spermicide. It blocks sperm from entering the uterus. The cap should be left in place for 6-8 hours after sex and removed within 48 hours. A cervical cap must be prescribed and fitted by a health care provider. It should be replaced every 2 years. °Sponge ° °A sponge is a soft, circular piece of polyurethane foam with spermicide on it. The sponge helps block sperm from entering the uterus, and the spermicide kills sperm. To use it, you make it wet and then insert it into the vagina. It should be inserted before sex, left in for at least 6 hours after sex, and removed and thrown away within 30 hours. °Spermicides °Spermicides are chemicals that kill or block sperm from entering the cervix and uterus. They can come as a cream, jelly, suppository, foam, or tablet. A spermicide should be inserted into the vagina with an applicator at least 10-15 minutes before sex to allow time for it to work. The process must be repeated every time you have sex. Spermicides do not require a prescription. °Intrauterine contraception °Intrauterine device (IUD) °An IUD is a T-shaped device that is put in a woman's uterus. There are two types: °· Hormone IUD.This type contains progestin, a synthetic form of the hormone progesterone. This type can stay in place for 3-5 years. °· Copper IUD.This type is wrapped in copper wire. It can stay in place for 10 years. ° °Permanent methods of contraception °Female tubal ligation °In this method, a woman's fallopian tubes are sealed, tied, or blocked during surgery to prevent eggs from traveling to the uterus. °Hysteroscopic sterilization °In this method, a small, flexible insert is placed into each fallopian tube. The inserts cause scar tissue to form in the fallopian tubes and block them, so sperm cannot reach an egg. The procedure takes about 3 months to be effective. Another form of birth control must be used during those 3 months. °Female  sterilization °This is a procedure to tie off the tubes that carry sperm (vasectomy). After the procedure, the man can still ejaculate fluid (semen). °Natural planning methods °Natural family planning °In this method, a couple does not have sex on days when the woman could become pregnant. °Calendar method °This means keeping   track of the length of each menstrual cycle, identifying the days when pregnancy can happen, and not having sex on those days. °Ovulation method °In this method, a couple avoids sex during ovulation. °Symptothermal method °This method involves not having sex during ovulation. The woman typically checks for ovulation by watching changes in her temperature and in the consistency of cervical mucus. °Post-ovulation method °In this method, a couple waits to have sex until after ovulation. °Summary °· Contraception, also called birth control, means methods or devices that prevent pregnancy. °· Hormonal methods of contraception include implants, injections, pills, patches, vaginal rings, and emergency contraceptives. °· Barrier methods of contraception can include female condoms, female condoms, diaphragms, cervical caps, sponges, and spermicides. °· There are two types of IUDs (intrauterine devices). An IUD can be put in a woman's uterus to prevent pregnancy for 3-5 years. °· Permanent sterilization can be done through a procedure for males, females, or both. °· Natural family planning methods involve not having sex on days when the woman could become pregnant. °This information is not intended to replace advice given to you by your health care provider. Make sure you discuss any questions you have with your health care provider. °Document Released: 10/05/2005 Document Revised: 10/07/2017 Document Reviewed: 11/07/2016 °[employer] Interactive Patient Education © 2019 [employer] ° ° °Breastfeeding ° °Choosing to breastfeed is one of the best decisions you can make for yourself and your baby. A change in  hormones during pregnancy causes your breasts to make breast milk in your milk-producing glands. Hormones prevent breast milk from being released before your baby is born. They also prompt milk flow after birth. Once breastfeeding has begun, thoughts of your baby, as well as his or her sucking or crying, can stimulate the release of milk from your milk-producing glands. °Benefits of breastfeeding °Research shows that breastfeeding offers many health benefits for infants and mothers. It also offers a cost-free and convenient way to feed your baby. °For your baby °· Your first milk (colostrum) helps your baby's digestive system to function better. °· Special cells in your milk (antibodies) help your baby to fight off infections. °· Breastfed babies are less likely to develop asthma, allergies, obesity, or type 2 diabetes. They are also at lower risk for sudden infant death syndrome (SIDS). °· Nutrients in breast milk are better able to meet your baby’s needs compared to infant formula. °· Breast milk improves your baby's brain development. °For you °· Breastfeeding helps to create a very special bond between you and your baby. °· Breastfeeding is convenient. Breast milk costs nothing and is always available at the correct temperature. °· Breastfeeding helps to burn calories. It helps you to lose the weight that you gained during pregnancy. °· Breastfeeding makes your uterus return faster to its size before pregnancy. It also slows bleeding (lochia) after you give birth. °· Breastfeeding helps to lower your risk of developing type 2 diabetes, osteoporosis, rheumatoid arthritis, cardiovascular disease, and breast, ovarian, uterine, and endometrial cancer later in life. °Breastfeeding basics °Starting breastfeeding °· Find a comfortable place to sit or lie down, with your neck and back well-supported. °· Place a pillow or a rolled-up blanket under your baby to bring him or her to the level of your breast (if you are  seated). Nursing pillows are specially designed to help support your arms and your baby while you breastfeed. °· Make sure that your baby's tummy (abdomen) is facing your abdomen. °· Gently massage your breast. With your fingertips, massage from   the outer edges of your breast inward toward the nipple. This encourages milk flow. If your milk flows slowly, you may need to continue this action during the feeding. °· Support your breast with 4 fingers underneath and your thumb above your nipple (make the letter "C" with your hand). Make sure your fingers are well away from your nipple and your baby’s mouth. °· Stroke your baby's lips gently with your finger or nipple. °· When your baby's mouth is open wide enough, quickly bring your baby to your breast, placing your entire nipple and as much of the areola as possible into your baby's mouth. The areola is the colored area around your nipple. °? More areola should be visible above your baby's upper lip than below the lower lip. °? Your baby's lips should be opened and extended outward (flanged) to ensure an adequate, comfortable latch. °? Your baby's tongue should be between his or her lower gum and your breast. °· Make sure that your baby's mouth is correctly positioned around your nipple (latched). Your baby's lips should create a seal on your breast and be turned out (everted). °· It is common for your baby to suck about 2-3 minutes in order to start the flow of breast milk. °Latching °Teaching your baby how to latch onto your breast properly is very important. An improper latch can cause nipple pain, decreased milk supply, and poor weight gain in your baby. Also, if your baby is not latched onto your nipple properly, he or she may swallow some air during feeding. This can make your baby fussy. Burping your baby when you switch breasts during the feeding can help to get rid of the air. However, teaching your baby to latch on properly is still the best way to prevent  fussiness from swallowing air while breastfeeding. °Signs that your baby has successfully latched onto your nipple °· Silent tugging or silent sucking, without causing you pain. Infant's lips should be extended outward (flanged). °· Swallowing heard between every 3-4 sucks once your milk has started to flow (after your let-down milk reflex occurs). °· Muscle movement above and in front of his or her ears while sucking. °Signs that your baby has not successfully latched onto your nipple °· Sucking sounds or smacking sounds from your baby while breastfeeding. °· Nipple pain. °If you think your baby has not latched on correctly, slip your finger into the corner of your baby’s mouth to break the suction and place it between your baby's gums. Attempt to start breastfeeding again. °Signs of successful breastfeeding °Signs from your baby °· Your baby will gradually decrease the number of sucks or will completely stop sucking. °· Your baby will fall asleep. °· Your baby's body will relax. °· Your baby will retain a small amount of milk in his or her mouth. °· Your baby will let go of your breast by himself or herself. °Signs from you °· Breasts that have increased in firmness, weight, and size 1-3 hours after feeding. °· Breasts that are softer immediately after breastfeeding. °· Increased milk volume, as well as a change in milk consistency and color by the fifth day of breastfeeding. °· Nipples that are not sore, cracked, or bleeding. °Signs that your baby is getting enough milk °· Wetting at least 1-2 diapers during the first 24 hours after birth. °· Wetting at least 5-6 diapers every 24 hours for the first week after birth. The urine should be clear or pale yellow by the age of 5 days. °·   Wetting 6-8 diapers every 24 hours as your baby continues to grow and develop. °· At least 3 stools in a 24-hour period by the age of 5 days. The stool should be soft and yellow. °· At least 3 stools in a 24-hour period by the age of 7  days. The stool should be seedy and yellow. °· No loss of weight greater than 10% of birth weight during the first 3 days of life. °· Average weight gain of 4-7 oz (113-198 g) per week after the age of 4 days. °· Consistent daily weight gain by the age of 5 days, without weight loss after the age of 2 weeks. °After a feeding, your baby may spit up a small amount of milk. This is normal. °Breastfeeding frequency and duration °Frequent feeding will help you make more milk and can prevent sore nipples and extremely full breasts (breast engorgement). Breastfeed when you feel the need to reduce the fullness of your breasts or when your baby shows signs of hunger. This is called "breastfeeding on demand." Signs that your baby is hungry include: °· Increased alertness, activity, or restlessness. °· Movement of the head from side to side. °· Opening of the mouth when the corner of the mouth or cheek is stroked (rooting). °· Increased sucking sounds, smacking lips, cooing, sighing, or squeaking. °· Hand-to-mouth movements and sucking on fingers or hands. °· Fussing or crying. °Avoid introducing a pacifier to your baby in the first 4-6 weeks after your baby is born. After this time, you may choose to use a pacifier. Research has shown that pacifier use during the first year of a baby's life decreases the risk of sudden infant death syndrome (SIDS). °Allow your baby to feed on each breast as long as he or she wants. When your baby unlatches or falls asleep while feeding from the first breast, offer the second breast. Because newborns are often sleepy in the first few weeks of life, you may need to awaken your baby to get him or her to feed. °Breastfeeding times will vary from baby to baby. However, the following rules can serve as a guide to help you make sure that your baby is properly fed: °· Newborns (babies 4 weeks of age or younger) may breastfeed every 1-3 hours. °· Newborns should not go without breastfeeding for longer  than 3 hours during the day or 5 hours during the night. °· You should breastfeed your baby a minimum of 8 times in a 24-hour period. °Breast milk pumping ° °  ° °Pumping and storing breast milk allows you to make sure that your baby is exclusively fed your breast milk, even at times when you are unable to breastfeed. This is especially important if you go back to work while you are still breastfeeding, or if you are not able to be present during feedings. Your lactation consultant can help you find a method of pumping that works best for you and give you guidelines about how long it is safe to store breast milk. °Caring for your breasts while you breastfeed °Nipples can become dry, cracked, and sore while breastfeeding. The following recommendations can help keep your breasts moisturized and healthy: °· Avoid using soap on your nipples. °· Wear a supportive bra designed especially for nursing. Avoid wearing underwire-style bras or extremely tight bras (sports bras). °· Air-dry your nipples for 3-4 minutes after each feeding. °· Use only cotton bra pads to absorb leaked breast milk. Leaking of breast milk between feedings is   normal. °· Use lanolin on your nipples after breastfeeding. Lanolin helps to maintain your skin's normal moisture barrier. Pure lanolin is not harmful (not toxic) to your baby. You may also hand express a few drops of breast milk and gently massage that milk into your nipples and allow the milk to air-dry. °In the first few weeks after giving birth, some women experience breast engorgement. Engorgement can make your breasts feel heavy, warm, and tender to the touch. Engorgement peaks within 3-5 days after you give birth. The following recommendations can help to ease engorgement: °· Completely empty your breasts while breastfeeding or pumping. You may want to start by applying warm, moist heat (in the shower or with warm, water-soaked hand towels) just before feeding or pumping. This increases  circulation and helps the milk flow. If your baby does not completely empty your breasts while breastfeeding, pump any extra milk after he or she is finished. °· Apply ice packs to your breasts immediately after breastfeeding or pumping, unless this is too uncomfortable for you. To do this: °? Put ice in a plastic bag. °? Place a towel between your skin and the bag. °? Leave the ice on for 20 minutes, 2-3 times a day. °· Make sure that your baby is latched on and positioned properly while breastfeeding. °If engorgement persists after 48 hours of following these recommendations, contact your health care provider or a lactation consultant. °Overall health care recommendations while breastfeeding °· Eat 3 healthy meals and 3 snacks every day. Well-nourished mothers who are breastfeeding need an additional 450-500 calories a day. You can meet this requirement by increasing the amount of a balanced diet that you eat. °· Drink enough water to keep your urine pale yellow or clear. °· Rest often, relax, and continue to take your prenatal vitamins to prevent fatigue, stress, and low vitamin and mineral levels in your body (nutrient deficiencies). °· Do not use any products that contain nicotine or tobacco, such as cigarettes and e-cigarettes. Your baby may be harmed by chemicals from cigarettes that pass into breast milk and exposure to secondhand smoke. If you need help quitting, ask your health care provider. °· Avoid alcohol. °· Do not use illegal drugs or marijuana. °· Talk with your health care provider before taking any medicines. These include over-the-counter and prescription medicines as well as vitamins and herbal supplements. Some medicines that may be harmful to your baby can pass through breast milk. °· It is possible to become pregnant while breastfeeding. If birth control is desired, ask your health care provider about options that will be safe while breastfeeding your baby. °Where to find more  information: °[employer]: www.llli.org °Contact a health care provider if: °· You feel like you want to stop breastfeeding or have become frustrated with breastfeeding. °· Your nipples are cracked or bleeding. °· Your breasts are red, tender, or warm. °· You have: °? Painful breasts or nipples. °? A swollen area on either breast. °? A fever or chills. °? Nausea or vomiting. °? Drainage other than breast milk from your nipples. °· Your breasts do not become full before feedings by the fifth day after you give birth. °· You feel sad and depressed. °· Your baby is: °? Too sleepy to eat well. °? Having trouble sleeping. °? More than 1 week old and wetting fewer than 6 diapers in a 24-hour period. °? Not gaining weight by 5 days of age. °· Your baby has fewer than 3 stools in a   24-hour period. °· Your baby's skin or the white parts of his or her eyes become yellow. °Get help right away if: °· Your baby is overly tired (lethargic) and does not want to wake up and feed. °· Your baby develops an unexplained fever. °Summary °· Breastfeeding offers many health benefits for infant and mothers. °· Try to breastfeed your infant when he or she shows early signs of hunger. °· Gently tickle or stroke your baby's lips with your finger or nipple to allow the baby to open his or her mouth. Bring the baby to your breast. Make sure that much of the areola is in your baby's mouth. Offer one side and burp the baby before you offer the other side. °· Talk with your health care provider or lactation consultant if you have questions or you face problems as you breastfeed. °This information is not intended to replace advice given to you by your health care provider. Make sure you discuss any questions you have with your health care provider. °Document Released: 10/05/2005 Document Revised: 11/06/2016 Document Reviewed: 11/06/2016 °Elsevier Interactive Patient Education © 2019 [employer] ° °

## 2018-12-02 LAB — CYTOLOGY - PAP
BACTERIAL VAGINITIS: POSITIVE — AB
CANDIDA VAGINITIS: NEGATIVE
Chlamydia: NEGATIVE
Diagnosis: NEGATIVE
NEISSERIA GONORRHEA: NEGATIVE
Trichomonas: NEGATIVE

## 2018-12-04 LAB — URINE CULTURE, OB REFLEX

## 2018-12-04 LAB — CULTURE, OB URINE

## 2018-12-05 MED ORDER — METRONIDAZOLE 500 MG PO TABS: 500.0000 mg | ORAL_TABLET | Freq: Two times a day (BID) | ORAL | 0 refills | Status: DC

## 2018-12-05 NOTE — Addendum Note (Signed)
Addended by: Catalina Antigua on: 12/05/2018 08:54 AM   Modules accepted: Orders

## 2018-12-12 LAB — OBSTETRIC PANEL, INCLUDING HIV
ANTIBODY SCREEN: NEGATIVE
BASOS: 0 %
Basophils Absolute: 0 10*3/uL (ref 0.0–0.2)
EOS (ABSOLUTE): 0.2 10*3/uL (ref 0.0–0.4)
EOS: 2 %
HEMOGLOBIN: 12.1 g/dL (ref 11.1–15.9)
HIV SCREEN 4TH GENERATION: NONREACTIVE
Hematocrit: 35.2 % (ref 34.0–46.6)
Hepatitis B Surface Ag: NEGATIVE
IMMATURE GRANS (ABS): 0.1 10*3/uL (ref 0.0–0.1)
Immature Granulocytes: 1 %
LYMPHS ABS: 2.3 10*3/uL (ref 0.7–3.1)
LYMPHS: 27 %
MCH: 32.1 pg (ref 26.6–33.0)
MCHC: 34.4 g/dL (ref 31.5–35.7)
MCV: 93 fL (ref 79–97)
Monocytes Absolute: 0.7 10*3/uL (ref 0.1–0.9)
Monocytes: 9 %
NEUTROS ABS: 5.3 10*3/uL (ref 1.4–7.0)
Neutrophils: 61 %
Platelets: 231 10*3/uL (ref 150–450)
RBC: 3.77 x10E6/uL (ref 3.77–5.28)
RDW: 12.4 % (ref 11.7–15.4)
RH TYPE: POSITIVE
RPR Ser Ql: REACTIVE — AB
Rubella Antibodies, IGG: 1.87 index (ref >0.99)
WBC: 8.6 10*3/uL (ref 3.4–10.8)

## 2018-12-12 LAB — AFP, SERUM, OPEN SPINA BIFIDA
AFP MOM: 1.32
AFP Value: 33.2 ng/mL
Gest. Age on Collection Date: 15.1 weeks
Maternal Age At EDD: 23 yr
OSBR RISK 1 IN: 9058
TEST RESULTS AFP: NEGATIVE
Weight: 226 [lb_av]

## 2018-12-12 LAB — CYSTIC FIBROSIS MUTATION 97: GENE DIS ANAL CARRIER INTERP BLD/T-IMP: NOT DETECTED

## 2018-12-12 LAB — SMN1 COPY NUMBER ANALYSIS (SMA CARRIER SCREENING)

## 2018-12-12 LAB — HEMOGLOBINOPATHY EVALUATION
HGB A: 97.2 % (ref 96.4–98.8)
HGB C: 0 %
HGB S: 0 %
HGB VARIANT: 0 %
Hemoglobin A2 Quantitation: 2.8 % (ref 1.8–3.2)
Hemoglobin F Quantitation: 0 % (ref 0.0–2.0)

## 2018-12-12 LAB — RPR, QUANT+TP ABS (REFLEX)
Rapid Plasma Reagin, Quant: 1:1 {titer} — ABNORMAL HIGH
TREPONEMA PALLIDUM AB: NONREACTIVE

## 2018-12-12 LAB — HEMOGLOBIN A1C
ESTIMATED AVERAGE GLUCOSE: 103 mg/dL
HEMOGLOBIN A1C: 5.2 % (ref 4.8–5.6)

## 2018-12-13 ENCOUNTER — Telehealth: Payer: Self-pay

## 2018-12-13 NOTE — Telephone Encounter (Signed)
Received telephone call from [facility] requesting dental clearance letter for patient to be seen in their office on Thursday, 12/15/18 for tooth pain.

## 2018-12-13 NOTE — Telephone Encounter (Signed)
Faxed dental Clearance Letter:  Attn: Perla         [facility]         Office: [phone]         Fax: [phone]

## 2018-12-28 ENCOUNTER — Other Ambulatory Visit: Payer: Self-pay

## 2018-12-28 ENCOUNTER — Ambulatory Visit (HOSPITAL_COMMUNITY)
Admission: RE | Admit: 2018-12-28 | Discharge: 2018-12-28 | Disposition: A | Discharge status: Home / Self Care | Payer: Medicaid Other | Source: Ambulatory Visit | Attending: Obstetrics and Gynecology | Admitting: Obstetrics and Gynecology

## 2018-12-28 ENCOUNTER — Other Ambulatory Visit (HOSPITAL_COMMUNITY): Payer: Self-pay | Admitting: *Deleted

## 2018-12-28 DIAGNOSIS — Z363 Encounter for antenatal screening for malformations: Secondary | ICD-10-CM | POA: Diagnosis not present

## 2018-12-28 DIAGNOSIS — Z3A19 19 weeks gestation of pregnancy: Secondary | ICD-10-CM | POA: Diagnosis not present

## 2018-12-28 DIAGNOSIS — Z362 Encounter for other antenatal screening follow-up: Secondary | ICD-10-CM

## 2018-12-28 DIAGNOSIS — Z3402 Encounter for supervision of normal first pregnancy, second trimester: Secondary | ICD-10-CM | POA: Diagnosis not present

## 2019-01-02 ENCOUNTER — Encounter (HOSPITAL_COMMUNITY): Payer: Self-pay | Admitting: *Deleted

## 2019-01-02 ENCOUNTER — Emergency Department (HOSPITAL_COMMUNITY)
Admission: EM | Admit: 2019-01-02 | Discharge: 2019-01-02 | Disposition: A | Discharge status: Home / Self Care | Payer: Medicaid Other | Attending: Emergency Medicine | Admitting: Emergency Medicine

## 2019-01-02 ENCOUNTER — Other Ambulatory Visit: Payer: Self-pay

## 2019-01-02 ENCOUNTER — Encounter: Payer: Self-pay | Admitting: Obstetrics and Gynecology

## 2019-01-02 ENCOUNTER — Ambulatory Visit (INDEPENDENT_AMBULATORY_CARE_PROVIDER_SITE_OTHER): Payer: Medicaid Other | Admitting: Obstetrics and Gynecology

## 2019-01-02 VITALS — BP 95/66 | HR 102 | Wt 232.5 lb

## 2019-01-02 DIAGNOSIS — Z79899 Other long term (current) drug therapy: Secondary | ICD-10-CM | POA: Insufficient documentation

## 2019-01-02 DIAGNOSIS — O99512 Diseases of the respiratory system complicating pregnancy, second trimester: Secondary | ICD-10-CM | POA: Insufficient documentation

## 2019-01-02 DIAGNOSIS — Z91040 Latex allergy status: Secondary | ICD-10-CM | POA: Insufficient documentation

## 2019-01-02 DIAGNOSIS — J069 Acute upper respiratory infection, unspecified: Secondary | ICD-10-CM | POA: Diagnosis not present

## 2019-01-02 DIAGNOSIS — Z3A19 19 weeks gestation of pregnancy: Secondary | ICD-10-CM | POA: Insufficient documentation

## 2019-01-02 DIAGNOSIS — O99210 Obesity complicating pregnancy, unspecified trimester: Secondary | ICD-10-CM

## 2019-01-02 DIAGNOSIS — Z3402 Encounter for supervision of normal first pregnancy, second trimester: Secondary | ICD-10-CM

## 2019-01-02 DIAGNOSIS — Z87891 Personal history of nicotine dependence: Secondary | ICD-10-CM | POA: Diagnosis not present

## 2019-01-02 DIAGNOSIS — B9789 Other viral agents as the cause of diseases classified elsewhere: Secondary | ICD-10-CM

## 2019-01-02 DIAGNOSIS — A6000 Herpesviral infection of urogenital system, unspecified: Secondary | ICD-10-CM

## 2019-01-02 MED ORDER — ALBUTEROL SULFATE HFA 108 (90 BASE) MCG/ACT IN AERS
2.0000 | INHALATION_SPRAY | Freq: Once | RESPIRATORY_TRACT | Status: AC
  Administered 2019-01-02: 2 via RESPIRATORY_TRACT
  Filled 2019-01-02: qty 6.7

## 2019-01-02 NOTE — ED Provider Notes (Signed)
[facility]-EMERGENCY DEPT Provider Note   CSN: [id number] Arrival date & time: 01/02/19  1848    History   Chief Complaint Chief Complaint  Patient presents with  . Cough  . Shortness of Breath    HPI Taylor Reed is a 23 y.o. female.     23 year old female presents with complaint of cough x3 days, related this to her allergies, not taking any allergy medication.  Patient states that after her routine OB appointment for 19-week 5-day pregnancy she developed shortness of breath while she was at work walking around.  Patient denies fevers, chills, swelling.  States that she has wheezing at times.  No history of asthma or chronic lung disease, recently quit smoking.  Also states her upper chest feels sore to the touch, worse with coughing.  No other complaints or concerns.     Past Medical History:  Diagnosis Date  . Depression   . DUB (dysfunctional uterine bleeding)   . GC (gonococcus infection) 07/09/2014  . Seasonal allergies   . UTI (urinary tract infection) 07/09/2014    Patient Active Problem List   Diagnosis Date Noted  . Encounter for supervision of normal first pregnancy in second trimester 12/01/2018  . Maternal morbid obesity, antepartum (HCC) 12/01/2018  . 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 Acne 07/02/2015  . Surveillance for Depo-Provera contraception 05/10/2015  . Herpes genitalia 11/08/2014    History reviewed. No pertinent surgical history.   OB History    Gravida  1   Para      Term      Preterm      AB      Living        SAB      TAB      Ectopic      Multiple      Live Births               Home Medications    Prior to Admission medications   Medication Sig Start Date End Date Taking? Authorizing Provider  aspirin EC 81 MG tablet Take 1 tablet (81 mg total) by mouth daily. Take after 12 weeks for prevention of preeclampsia later in pregnancy 12/01/18   Constant, Peggy, MD  Doxylamine-Pyridoxine  (DICLEGIS) 10-10 MG TBEC Take 2 tablets by mouth at bedtime. If symptoms persist, add one tablet in the morning and one in the afternoon 12/01/18   Constant, Peggy, MD  famotidine (PEPCID) 20 MG tablet Take 1 tablet (20 mg total) by mouth 2 (two) times daily. 12/01/18   Constant, Peggy, MD  metoCLOPramide (REGLAN) 10 MG tablet Take 1 tablet (10 mg total) by mouth every 8 (eight) hours as needed for nausea. 10/29/18   Judeth Horn, NP  metroNIDAZOLE (FLAGYL) 500 MG tablet Take 1 tablet (500 mg total) by mouth 2 (two) times daily. Patient not taking: Reported on 01/02/2019 12/05/18   Constant, Peggy, MD    Family History Family History  Problem Relation Age of Onset  . Cancer Mother     Social History Social History   Tobacco Use  . Smoking status: Former Smoker    Types: Cigarettes  . Smokeless tobacco: Never Used  . Tobacco comment: none since dec 2019  Substance Use Topics  . Alcohol use: Not Currently    Comment: none since + UPT  . Drug use: Not Currently     Allergies   Latex   Review of Systems Review of Systems  Constitutional: Negative for chills and  fever.  HENT: Positive for congestion.   Respiratory: Positive for cough, shortness of breath and wheezing.   Cardiovascular: Positive for chest pain.  Gastrointestinal: Negative for nausea and vomiting.  Musculoskeletal: Negative for arthralgias and myalgias.  Skin: Negative for rash and wound.  Allergic/Immunologic: Negative for immunocompromised state.  Neurological: Negative for weakness and headaches.  Hematological: Negative for adenopathy.  Psychiatric/Behavioral: Negative for confusion.  All other systems reviewed and are negative.    Physical Exam Updated Vital Signs BP 126/73 (BP Location: Left Arm)   Pulse 100   Temp 98.6 F (37 C) (Oral)   Resp 16   LMP 08/17/2018   SpO2 100%   Physical Exam Vitals signs and nursing note reviewed.  Constitutional:      General: She is not in acute distress.     Appearance: She is well-developed. She is not diaphoretic.  HENT:     Head: Normocephalic and atraumatic.     Nose: Congestion present.     Mouth/Throat:     Mouth: Mucous membranes are moist.     Pharynx: No oropharyngeal exudate.  Cardiovascular:     Rate and Rhythm: Normal rate and regular rhythm.  Pulmonary:     Effort: Pulmonary effort is normal.     Breath sounds: Wheezing present. No decreased breath sounds.  Chest:    Musculoskeletal:     Right lower leg: No edema.     Left lower leg: No edema.  Skin:    General: Skin is warm and dry.     Capillary Refill: Capillary refill takes less than 2 seconds.  Neurological:     Mental Status: She is alert and oriented to person, place, and time.  Psychiatric:        Behavior: Behavior normal.      ED Treatments / Results  Labs (all labs ordered are listed, but only abnormal results are displayed) Labs Reviewed - No data to display  EKG None  Radiology No results found.  Procedures Procedures (including critical care time)  Medications Ordered in ED Medications  albuterol (PROVENTIL HFA;VENTOLIN HFA) 108 (90 Base) MCG/ACT inhaler 2 puff (2 puffs Inhalation Given 01/02/19 2126)     Initial Impression / Assessment and Plan / ED Course  I have reviewed the triage vital signs and the nursing notes.  Pertinent labs & imaging results that were available during my care of the patient were reviewed by me and considered in my medical decision making (see chart for details).  Clinical Course as of Jan 02 2223  Mon Jan 02, 2019  4950 78-year-old female, 19 weeks and 5 days pregnant with normal course of her pregnancy presents with complaint of cough x3 days now feeling short of breath with wheezing with chest wall tenderness.  On exam chest is reproduced with palpation, she has very mild wheezing noted.  Patient states she is a history of allergies and believes her symptoms are related to her allergies but not taking allergy  medicine.  Advised patient to take Zyrtec, given inhaler while in the ER and states that her shortness of breath is resolved.  Patient should follow-up with her OB.   [LM]    Clinical Course User Index [LM] Jeannie Fend, PA-C      Final Clinical Impressions(s) / ED Diagnoses   Final diagnoses:  Viral URI with cough    ED Discharge Orders    None       Jeannie Fend, PA-C 01/02/19 2224  Maia Plan, MD 01/02/19 2355

## 2019-01-02 NOTE — Progress Notes (Addendum)
   PRENATAL VISIT NOTE  Subjective:  Taylor Reed is a 23 y.o. G1P0 at [redacted]w[redacted]d being seen today for ongoing prenatal care.  She is currently monitored for the following issues for this low-risk pregnancy and has Herpes genitalia; Surveillance for Depo-Provera contraception; Latex allergy; Acne; Encounter for supervision of normal first pregnancy in second trimester; and Maternal morbid obesity, antepartum (HCC) on their problem list.  Patient reports no complaints.  Contractions: Not present. Vag. Bleeding: None.   . Denies leaking of fluid.   The following portions of the patient's history were reviewed and updated as appropriate: allergies, current medications, past family history, past medical history, past social history, past surgical history and problem list.   Objective:   Vitals:   01/02/19 1013  BP: 95/66  Pulse: (!) 102  Weight: 232 lb 8 oz (105.5 kg)    Fetal Status: Fetal Heart Rate (bpm): 154         General:  Alert, oriented and cooperative. Patient is in no acute distress.  Skin: Skin is warm and dry. No rash noted.   Cardiovascular: Normal heart rate noted  Respiratory: Normal respiratory effort, no problems with respiration noted  Abdomen: Soft, gravid, appropriate for gestational age.  Pain/Pressure: Absent     Pelvic: Cervical exam deferred        Extremities: Normal range of motion.  Edema: None  Mental Status: Normal mood and affect. Normal behavior. Normal judgment and thought content.   Assessment and Plan:  Pregnancy: G1P0 at [redacted]w[redacted]d 1. Encounter for supervision of normal first pregnancy in second trimester Patient is doing well without complaints Incomplete anatomy ultrasound- follow up scheduled in April Patient reports a cough related to her allergies. She denies fever, chills, or other URI symptoms. She is not currently taking any medication. Advised patient to use over the counter allergy medication.  2. Maternal morbid obesity, antepartum (HCC)    3. Genital herpes simplex, unspecified site Prophylaxis at 36 weeks  Preterm labor symptoms and general obstetric precautions including but not limited to vaginal bleeding, contractions, leaking of fluid and fetal movement were reviewed in detail with the patient. Please refer to After Visit Summary for other counseling recommendations.   Return in about 4 weeks (around 01/30/2019) for ROB.  Future Appointments  Date Time Provider Department Center  01/26/2019  2:15 PM [facility] [place] 4 [facility]    Catalina Antigua, MD

## 2019-01-02 NOTE — Progress Notes (Signed)
Pt is here for ROB. [redacted]w[redacted]d.

## 2019-01-02 NOTE — Discharge Instructions (Addendum)
Take Zyrtec daily. Use saline sinus rinse daily. Use inhaler only as needed as prescribed every 4-6 hours, 1 puff for wheezing. Follow-up with your primary care/OB.

## 2019-01-02 NOTE — ED Triage Notes (Addendum)
Pt reports productive cough x 3 days ago, started to have sob today.  She also endorses pain in her chest when coughing.  No hx of asthma.  She is A&Ox4, in NAD.  Pt is also 5 months pregnant

## 2019-01-26 ENCOUNTER — Ambulatory Visit (HOSPITAL_COMMUNITY): Payer: Medicaid Other

## 2019-02-14 ENCOUNTER — Other Ambulatory Visit: Payer: Self-pay | Admitting: Obstetrics and Gynecology

## 2019-02-14 DIAGNOSIS — Z3402 Encounter for supervision of normal first pregnancy, second trimester: Secondary | ICD-10-CM

## 2019-02-17 ENCOUNTER — Telehealth: Payer: Self-pay

## 2019-02-17 NOTE — Telephone Encounter (Signed)
Called patient to verify access to BP cuff and enroll in [place] if needed. Left message.  Rolm Bookbinder, CNM 02/17/19 10:52 AM

## 2019-03-02 ENCOUNTER — Encounter (HOSPITAL_COMMUNITY): Payer: Self-pay

## 2019-03-02 ENCOUNTER — Other Ambulatory Visit: Payer: Self-pay

## 2019-03-02 ENCOUNTER — Ambulatory Visit (HOSPITAL_COMMUNITY)
Admission: RE | Admit: 2019-03-02 | Discharge: 2019-03-02 | Disposition: A | Discharge status: Home / Self Care | Payer: Medicaid Other | Source: Ambulatory Visit | Attending: Obstetrics and Gynecology | Admitting: Obstetrics and Gynecology

## 2019-03-02 ENCOUNTER — Ambulatory Visit (HOSPITAL_COMMUNITY): Payer: Medicaid Other

## 2019-03-02 DIAGNOSIS — Z362 Encounter for other antenatal screening follow-up: Secondary | ICD-10-CM | POA: Insufficient documentation

## 2019-03-02 DIAGNOSIS — Z3A28 28 weeks gestation of pregnancy: Secondary | ICD-10-CM

## 2019-03-03 ENCOUNTER — Ambulatory Visit (INDEPENDENT_AMBULATORY_CARE_PROVIDER_SITE_OTHER): Payer: Medicaid Other | Admitting: Advanced Practice Midwife

## 2019-03-03 ENCOUNTER — Other Ambulatory Visit: Payer: Medicaid Other

## 2019-03-03 VITALS — BP 108/68 | HR 103 | Wt 248.0 lb

## 2019-03-03 DIAGNOSIS — Z3402 Encounter for supervision of normal first pregnancy, second trimester: Secondary | ICD-10-CM

## 2019-03-03 DIAGNOSIS — O99210 Obesity complicating pregnancy, unspecified trimester: Secondary | ICD-10-CM

## 2019-03-03 DIAGNOSIS — O98813 Other maternal infectious and parasitic diseases complicating pregnancy, third trimester: Secondary | ICD-10-CM

## 2019-03-03 DIAGNOSIS — A6000 Herpesviral infection of urogenital system, unspecified: Secondary | ICD-10-CM

## 2019-03-03 DIAGNOSIS — Z91040 Latex allergy status: Secondary | ICD-10-CM

## 2019-03-03 DIAGNOSIS — O99213 Obesity complicating pregnancy, third trimester: Secondary | ICD-10-CM

## 2019-03-03 DIAGNOSIS — Z3A28 28 weeks gestation of pregnancy: Secondary | ICD-10-CM

## 2019-03-03 MED ORDER — BLOOD PRESSURE MONITOR KIT: 1.0000 | PACK | Freq: Every day | 0 refills | Status: DC

## 2019-03-03 NOTE — Patient Instructions (Addendum)
[email or web address]   TDaP Vaccine Pregnancy Get the Whooping Cough Vaccine While You Are Pregnant (CDC)  It is important for women to get the whooping cough vaccine in the third trimester of each pregnancy. Vaccines are the best way to prevent this disease. There are 2 different whooping cough vaccines. Both vaccines combine protection against whooping cough, tetanus and diphtheria, but they are for different age groups: Tdap: for everyone 11 years or older, including pregnant women  DTaP: for children 2 months through 78 years of age  You need the whooping cough vaccine during each of your pregnancies The recommended time to get the shot is during your 27th through 36th week of pregnancy, preferably during the earlier part of this time period. The Centers for Disease Control and Prevention (CDC) recommends that pregnant women receive the whooping cough vaccine for adolescents and adults (called Tdap vaccine) during the third trimester of each pregnancy. The recommended time to get the shot is during your 27th through 36th week of pregnancy, preferably during the earlier part of this time period. This replaces the original recommendation that pregnant women get the vaccine only if they had not previously received it. The [employer]nec[employer] and the [employer] support this recommendation.  You should get the whooping cough vaccine while pregnant to pass protection to your baby frame support disabled and/or not supported in this browser  Learn why Vernona Rieger decided to get the whooping cough vaccine in her 3rd trimester of pregnancy and how her baby girl was born with some protection against the disease. Also available on YouTube. After receiving the whooping cough vaccine, your body will create protective antibodies (proteins produced by the body to fight off diseases) and pass some of them to your baby before birth. These antibodies provide  your baby some short-term protection against whooping cough in early life. These antibodies can also protect your baby from some of the more serious complications that come along with whooping cough. Your protective antibodies are at their highest about 2 weeks after getting the vaccine, but it takes time to pass them to your baby. So the preferred time to get the whooping cough vaccine is early in your third trimester. The amount of whooping cough antibodies in your body decreases over time. That is why CDC recommends you get a whooping cough vaccine during each pregnancy. Doing so allows each of your babies to get the greatest number of protective antibodies from you. This means each of your babies will get the best protection possible against this disease.  Getting the whooping cough vaccine while pregnant is better than getting the vaccine after you give birth Whooping cough vaccination during pregnancy is ideal so your baby will have short-term protection as soon as he is born. This early protection is important because your baby will not start getting his whooping cough vaccines until he is 2 months old. These first few months of life are when your baby is at greatest risk for catching whooping cough. This is also when hes at greatest risk for having severe, potentially life-threating complications from the infection. To avoid that gap in protection, it is best to get a whooping cough vaccine during pregnancy. You will then pass protection to your baby before he is born. To continue protecting your baby, he should get whooping cough vaccines starting at 2 months old. You may never have gotten the Tdap vaccine before and did not get it during this pregnancy. If so, you  should make sure to get the vaccine immediately after you give birth, before leaving the hospital or birthing center. It will take about 2 weeks before your body develops protection (antibodies) in response to the vaccine. Once you have  protection from the vaccine, you are less likely to give whooping cough to your newborn while caring for him. But remember, your baby will still be at risk for catching whooping cough from others. A recent study looked to see how effective Tdap was at preventing whooping cough in babies whose mothers got the vaccine while pregnant or in the hospital after giving birth. The study found that getting Tdap between 27 through 36 weeks of pregnancy is 85% more effective at preventing whooping cough in babies younger than 2 months old. Blood tests cannot tell if you need a whooping cough vaccine There are no blood tests that can tell you if you have enough antibodies in your body to protect yourself or your baby against whooping cough. Even if you have been sick with whooping cough in the past or previously received the vaccine, you still should get the vaccine during each pregnancy. Breastfeeding may pass some protective antibodies onto your baby By breastfeeding, you may pass some antibodies you have made in response to the vaccine to your baby. When you get a whooping cough vaccine during your pregnancy, you will have antibodies in your breast milk that you can share with your baby as soon as your milk comes in. However, your baby will not get protective antibodies immediately if you wait to get the whooping cough vaccine until after delivering your baby. This is because it takes about 2 weeks for your body to create antibodies. Learn more about the health benefits of breastfeeding.   Preterm Labor and Birth Information  The normal length of a pregnancy is 39-41 weeks. Preterm labor is when labor starts before 37 completed weeks of pregnancy. What are the risk factors for preterm labor? Preterm labor is more likely to occur in women who:  Have certain infections during pregnancy such as a bladder infection, sexually transmitted infection, or infection inside the uterus (chorioamnionitis).  Have a  shorter-than-normal cervix.  Have gone into preterm labor before.  Have had surgery on their cervix.  Are younger than age 37 or older than age 46.  Are African American.  Are pregnant with twins or multiple babies (multiple gestation).  Take street drugs or smoke while pregnant.  Do not gain enough weight while pregnant.  Became pregnant shortly after having been pregnant. What are the symptoms of preterm labor? Symptoms of preterm labor include:  Cramps similar to those that can happen during a menstrual period. The cramps may happen with diarrhea.  Pain in the abdomen or lower back.  Regular uterine contractions that may feel like tightening of the abdomen.  A feeling of increased pressure in the pelvis.  Increased watery or bloody mucus discharge from the vagina.  Water breaking (ruptured amniotic sac). Why is it important to recognize signs of preterm labor? It is important to recognize signs of preterm labor because babies who are born prematurely may not be fully developed. This can put them at an increased risk for:  Long-term (chronic) heart and lung problems.  Difficulty immediately after birth with regulating body systems, including blood sugar, body temperature, heart rate, and breathing rate.  Bleeding in the brain.  Cerebral palsy.  Learning difficulties.  Death. These risks are highest for babies who are born before 34 weeks  of pregnancy. How is preterm labor treated? Treatment depends on the length of your pregnancy, your condition, and the health of your baby. It may involve:  Having a stitch (suture) placed in your cervix to prevent your cervix from opening too early (cerclage).  Taking or being given medicines, such as: ? Hormone medicines. These may be given early in pregnancy to help support the pregnancy. ? Medicine to stop contractions. ? Medicines to help mature the babys lungs. These may be prescribed if the risk of delivery is  high. ? Medicines to prevent your baby from developing cerebral palsy. If the labor happens before 34 weeks of pregnancy, you may need to stay in the hospital. What should I do if I think I am in preterm labor? If you think that you are going into preterm labor, call your health care provider right away. How can I prevent preterm labor in future pregnancies? To increase your chance of having a full-term pregnancy:  Do not use any tobacco products, such as cigarettes, chewing tobacco, and e-cigarettes. If you need help quitting, ask your health care provider.  Do not use street drugs or medicines that have not been prescribed to you during your pregnancy.  Talk with your health care provider before taking any herbal supplements, even if you have been taking them regularly.  Make sure you gain a healthy amount of weight during your pregnancy.  Watch for infection. If you think that you might have an infection, get it checked right away.  Make sure to tell your health care provider if you have gone into preterm labor before. This information is not intended to replace advice given to you by your health care provider. Make sure you discuss any questions you have with your health care provider. Document Released: 12/26/2003 Document Revised: 03/17/2016 Document Reviewed: 02/26/2016 Elsevier Interactive Patient Education  2019 [employer]   Third Trimester of Pregnancy The third trimester is from week 28 through week 40 (months 7 through 9). The third trimester is a time when the unborn baby (fetus) is growing rapidly. At the end of the ninth month, the fetus is about 20 inches in length and weighs 6-10 pounds. Body changes during your third trimester Your body will continue to go through many changes during pregnancy. The changes vary from woman to woman. During the third trimester:  Your weight will continue to increase. You can expect to gain 25-35 pounds (11-16 kg) by the end of the  pregnancy.  You may begin to get stretch marks on your hips, abdomen, and breasts.  You may urinate more often because the fetus is moving lower into your pelvis and pressing on your bladder.  You may develop or continue to have heartburn. This is caused by increased hormones that slow down muscles in the digestive tract.  You may develop or continue to have constipation because increased hormones slow digestion and cause the muscles that push waste through your intestines to relax.  You may develop hemorrhoids. These are swollen veins (varicose veins) in the rectum that can itch or be painful.  You may develop swollen, bulging veins (varicose veins) in your legs.  You may have increased body aches in the pelvis, back, or thighs. This is due to weight gain and increased hormones that are relaxing your joints.  You may have changes in your hair. These can include thickening of your hair, rapid growth, and changes in texture. Some women also have hair loss during or after pregnancy, or  hair that feels dry or thin. Your hair will most likely return to normal after your baby is born.  Your breasts will continue to grow and they will continue to become tender. A yellow fluid (colostrum) may leak from your breasts. This is the first milk you are producing for your baby.  Your belly button may stick out.  You may notice more swelling in your hands, face, or ankles.  You may have increased tingling or numbness in your hands, arms, and legs. The skin on your belly may also feel numb.  You may feel short of breath because of your expanding uterus.  You may have more problems sleeping. This can be caused by the size of your belly, increased need to urinate, and an increase in your body's metabolism.  You may notice the fetus "dropping," or moving lower in your abdomen (lightening).  You may have increased vaginal discharge.  You may notice your joints feel loose and you may have pain around  your pelvic bone. What to expect at prenatal visits You will have prenatal exams every 2 weeks until week 36. Then you will have weekly prenatal exams. During a routine prenatal visit:  You will be weighed to make sure you and the baby are growing normally.  Your blood pressure will be taken.  Your abdomen will be measured to track your baby's growth.  The fetal heartbeat will be listened to.  Any test results from the previous visit will be discussed.  You may have a cervical check near your due date to see if your cervix has softened or thinned (effaced).  You will be tested for Group B streptococcus. This happens between 35 and 37 weeks. Your health care provider may ask you:  What your birth plan is.  How you are feeling.  If you are feeling the baby move.  If you have had any abnormal symptoms, such as leaking fluid, bleeding, severe headaches, or abdominal cramping.  If you are using any tobacco products, including cigarettes, chewing tobacco, and electronic cigarettes.  If you have any questions. Other tests or screenings that may be performed during your third trimester include:  Blood tests that check for low iron levels (anemia).  Fetal testing to check the health, activity level, and growth of the fetus. Testing is done if you have certain medical conditions or if there are problems during the pregnancy.  Nonstress test (NST). This test checks the health of your baby to make sure there are no signs of problems, such as the baby not getting enough oxygen. During this test, a belt is placed around your belly. The baby is made to move, and its heart rate is monitored during movement. What is false labor? False labor is a condition in which you feel small, irregular tightenings of the muscles in the womb (contractions) that usually go away with rest, changing position, or drinking water. These are called Braxton Hicks contractions. Contractions may last for hours, days,  or even weeks before true labor sets in. If contractions come at regular intervals, become more frequent, increase in intensity, or become painful, you should see your health care provider. What are the signs of labor?  Abdominal cramps.  Regular contractions that start at 10 minutes apart and become stronger and more frequent with time.  Contractions that start on the top of the uterus and spread down to the lower abdomen and back.  Increased pelvic pressure and dull back pain.  A watery or bloody mucus  discharge that comes from the vagina.  Leaking of amniotic fluid. This is also known as your "water breaking." It could be a slow trickle or a gush. Let your health care provider know if it has a color or strange odor. If you have any of these signs, call your health care provider right away, even if it is before your due date. Follow these instructions at home: Medicines  Follow your health care provider's instructions regarding medicine use. Specific medicines may be either safe or unsafe to take during pregnancy.  Take a prenatal vitamin that contains at least 600 micrograms (mcg) of folic acid.  If you develop constipation, try taking a stool softener if your health care provider approves. Eating and drinking   Eat a balanced diet that includes fresh fruits and vegetables, whole grains, good sources of protein such as meat, eggs, or tofu, and low-fat dairy. Your health care provider will help you determine the amount of weight gain that is right for you.  Avoid raw meat and uncooked cheese. These carry germs that can cause birth defects in the baby.  If you have low calcium intake from food, talk to your health care provider about whether you should take a daily calcium supplement.  Eat four or five small meals rather than three large meals a day.  Limit foods that are high in fat and processed sugars, such as fried and sweet foods.  To prevent constipation: ? Drink enough  fluid to keep your urine clear or pale yellow. ? Eat foods that are high in fiber, such as fresh fruits and vegetables, whole grains, and beans. Activity  Exercise only as directed by your health care provider. Most women can continue their usual exercise routine during pregnancy. Try to exercise for 30 minutes at least 5 days a week. Stop exercising if you experience uterine contractions.  Avoid heavy lifting.  Do not exercise in extreme heat or humidity, or at high altitudes.  Wear low-heel, comfortable shoes.  Practice good posture.  You may continue to have sex unless your health care provider tells you otherwise. Relieving pain and discomfort  Take frequent breaks and rest with your legs elevated if you have leg cramps or low back pain.  Take warm sitz baths to soothe any pain or discomfort caused by hemorrhoids. Use hemorrhoid cream if your health care provider approves.  Wear a good support bra to prevent discomfort from breast tenderness.  If you develop varicose veins: ? Wear support pantyhose or compression stockings as told by your healthcare provider. ? Elevate your feet for 15 minutes, 3-4 times a day. Prenatal care  Write down your questions. Take them to your prenatal visits.  Keep all your prenatal visits as told by your health care provider. This is important. Safety  Wear your seat belt at all times when driving.  Make a list of emergency phone numbers, including numbers for family, friends, the hospital, and police and fire departments. General instructions  Avoid cat litter boxes and soil used by cats. These carry germs that can cause birth defects in the baby. If you have a cat, ask someone to clean the litter box for you.  Do not travel far distances unless it is absolutely necessary and only with the approval of your health care provider.  Do not use hot tubs, steam rooms, or saunas.  Do not drink alcohol.  Do not use any products that contain  nicotine or tobacco, such as cigarettes and e-cigarettes. If you  need help quitting, ask your health care provider.  Do not use any medicinal herbs or unprescribed drugs. These chemicals affect the formation and growth of the baby.  Do not douche or use tampons or scented sanitary pads.  Do not cross your legs for long periods of time.  To prepare for the arrival of your baby: ? Take prenatal classes to understand, practice, and ask questions about labor and delivery. ? Make a trial run to the hospital. ? Visit the hospital and tour the maternity area. ? Arrange for maternity or paternity leave through employers. ? Arrange for family and friends to take care of pets while you are in the hospital. ? Purchase a rear-facing car seat and make sure you know how to install it in your car. ? Pack your hospital bag. ? Prepare the babys nursery. Make sure to remove all pillows and stuffed animals from the baby's crib to prevent suffocation.  Visit your dentist if you have not gone during your pregnancy. Use a soft toothbrush to brush your teeth and be gentle when you floss. Contact a health care provider if:  You are unsure if you are in labor or if your water has broken.  You become dizzy.  You have mild pelvic cramps, pelvic pressure, or nagging pain in your abdominal area.  You have lower back pain.  You have persistent nausea, vomiting, or diarrhea.  You have an unusual or bad smelling vaginal discharge.  You have pain when you urinate. Get help right away if:  Your water breaks before 37 weeks.  You have regular contractions less than 5 minutes apart before 37 weeks.  You have a fever.  You are leaking fluid from your vagina.  You have spotting or bleeding from your vagina.  You have severe abdominal pain or cramping.  You have rapid weight loss or weight gain.  You have shortness of breath with chest pain.  You notice sudden or extreme swelling of your face, hands,  ankles, feet, or legs.  Your baby makes fewer than 10 movements in 2 hours.  You have severe headaches that do not go away when you take medicine.  You have vision changes. Summary  The third trimester is from week 28 through week 40, months 7 through 9. The third trimester is a time when the unborn baby (fetus) is growing rapidly.  During the third trimester, your discomfort may increase as you and your baby continue to gain weight. You may have abdominal, leg, and back pain, sleeping problems, and an increased need to urinate.  During the third trimester your breasts will keep growing and they will continue to become tender. A yellow fluid (colostrum) may leak from your breasts. This is the first milk you are producing for your baby.  False labor is a condition in which you feel small, irregular tightenings of the muscles in the womb (contractions) that eventually go away. These are called Braxton Hicks contractions. Contractions may last for hours, days, or even weeks before true labor sets in.  Signs of labor can include: abdominal cramps; regular contractions that start at 10 minutes apart and become stronger and more frequent with time; watery or bloody mucus discharge that comes from the vagina; increased pelvic pressure and dull back pain; and leaking of amniotic fluid. This information is not intended to replace advice given to you by your health care provider. Make sure you discuss any questions you have with your health care provider. Document Released: 09/29/2001 Document  Revised: 11/10/2016 Document Reviewed: 11/10/2016 Elsevier Interactive Patient Education  [employer].

## 2019-03-03 NOTE — Progress Notes (Signed)
   PRENATAL VISIT NOTE  Subjective:  Taylor Reed is a 23 y.o. G1P0 at [redacted]w[redacted]d being seen today for ongoing prenatal care.  She is currently monitored for the following issues for this low-risk pregnancy and has Herpes genitalia; Surveillance for Depo-Provera contraception; Latex allergy; Acne; Encounter for supervision of normal first pregnancy in second trimester; and Maternal morbid obesity, antepartum (HCC) on their problem list.  Patient reports no complaints.  Contractions: Not present. Vag. Bleeding: None.   . Denies leaking of fluid.   The following portions of the patient's history were reviewed and updated as appropriate: allergies, current medications, past family history, past medical history, past social history, past surgical history and problem list.   Pt is not fasting for labs today. Cuff not working.   Objective:   Vitals:   03/03/19 0951  BP: 108/68  Pulse: (!) 103  Weight: 248 lb (112.5 kg)    Fetal Status: Fetal Heart Rate (bpm): 148         General:  Alert, oriented and cooperative. Patient is in no acute distress.  Skin: Skin is warm and dry. No rash noted.   Cardiovascular: Normal heart rate noted  Respiratory: Normal respiratory effort, no problems with respiration noted  Abdomen: Soft, gravid, appropriate for gestational age.  Pain/Pressure: Absent     Pelvic: Cervical exam deferred        Extremities: Normal range of motion.  Edema: Trace  Mental Status: Normal mood and affect. Normal behavior. Normal judgment and thought content.   Assessment and Plan:  Pregnancy: G1P0 at [redacted]w[redacted]d 1. Encounter for supervision of normal first pregnancy in second trimester  - Rescheduled 28 weeks labs ASAP. Discussed need for fasting.  - Tdap vaccine greater than or equal to 7yo IM--Declined. Discussed recommendation. CDC handout givem - New BP cuff ordered.   2. Latex allergy   3. Maternal morbid obesity, antepartum (HCC)   4. Genital herpes simplex,  unspecified site - Valtrex 34 weeks  Preterm labor symptoms and general obstetric precautions including but not limited to vaginal bleeding, contractions, leaking of fluid and fetal movement were reviewed in detail with the patient. Please refer to After Visit Summary for other counseling recommendations.   Return in about 2 weeks (around 03/17/2019) for ROB.  Dorathy Kinsman, CNM

## 2019-03-03 NOTE — Progress Notes (Signed)
Pt not fasting; 2 gtt labs rescheduled.

## 2019-03-09 ENCOUNTER — Other Ambulatory Visit: Payer: Self-pay

## 2019-03-09 ENCOUNTER — Other Ambulatory Visit: Payer: Medicaid Other

## 2019-03-09 DIAGNOSIS — Z3402 Encounter for supervision of normal first pregnancy, second trimester: Secondary | ICD-10-CM | POA: Diagnosis not present

## 2019-03-11 DIAGNOSIS — Z3483 Encounter for supervision of other normal pregnancy, third trimester: Secondary | ICD-10-CM | POA: Diagnosis not present

## 2019-03-12 LAB — CBC
Hematocrit: 30.8 % — ABNORMAL LOW (ref 34.0–46.6)
Hemoglobin: 10.6 g/dL — ABNORMAL LOW (ref 11.1–15.9)
MCH: 30.9 pg (ref 26.6–33.0)
MCHC: 34.4 g/dL (ref 31.5–35.7)
MCV: 90 fL (ref 79–97)
Platelets: 245 10*3/uL (ref 150–450)
RBC: 3.43 x10E6/uL — ABNORMAL LOW (ref 3.77–5.28)
RDW: 12.8 % (ref 11.7–15.4)
WBC: 9.4 10*3/uL (ref 3.4–10.8)

## 2019-03-12 LAB — GLUCOSE TOLERANCE, 2 HOURS W/ 1HR
Glucose, 1 hour: 80 mg/dL (ref 65–179)
Glucose, 2 hour: 92 mg/dL (ref 65–152)
Glucose, Fasting: 75 mg/dL (ref 65–91)

## 2019-03-12 LAB — RPR: RPR Ser Ql: NONREACTIVE

## 2019-03-12 LAB — HIV ANTIBODY (ROUTINE TESTING W REFLEX): HIV Screen 4th Generation wRfx: NONREACTIVE

## 2019-03-17 ENCOUNTER — Ambulatory Visit (INDEPENDENT_AMBULATORY_CARE_PROVIDER_SITE_OTHER): Payer: Medicaid Other | Admitting: Advanced Practice Midwife

## 2019-03-17 ENCOUNTER — Other Ambulatory Visit: Payer: Self-pay

## 2019-03-17 DIAGNOSIS — Z3402 Encounter for supervision of normal first pregnancy, second trimester: Secondary | ICD-10-CM

## 2019-03-17 DIAGNOSIS — O99210 Obesity complicating pregnancy, unspecified trimester: Secondary | ICD-10-CM

## 2019-03-17 DIAGNOSIS — Z3403 Encounter for supervision of normal first pregnancy, third trimester: Secondary | ICD-10-CM

## 2019-03-17 DIAGNOSIS — O99213 Obesity complicating pregnancy, third trimester: Secondary | ICD-10-CM

## 2019-03-17 DIAGNOSIS — Z3A30 30 weeks gestation of pregnancy: Secondary | ICD-10-CM

## 2019-03-17 DIAGNOSIS — Z91040 Latex allergy status: Secondary | ICD-10-CM

## 2019-03-17 NOTE — Progress Notes (Signed)
I connected with@ on 03/17/19 at 11:10 AM EDT by: WebEx and verified that I am speaking with the correct person using two identifiers.  Patient is located at Home and provider is located at [facility].     The purpose of this virtual visit is to provide medical care while limiting exposure to the novel coronavirus. I discussed the limitations, risks, security and privacy concerns of performing an evaluation and management service by [facility] and the availability of in person appointments. I also discussed with the patient that there may be a patient responsible charge related to this service. By engaging in this virtual visit, you consent to the provision of healthcare.  Additionally, you authorize for your insurance to be billed for the services provided during this visit.  The patient expressed understanding and agreed to proceed.  The following staff members participated in the virtual visit:  None    PRENATAL VISIT NOTE  Subjective:  Taylor Reed is a 23 y.o. G1P0 at [redacted]w[redacted]d  for phone visit for ongoing prenatal care.  She is currently monitored for the following issues for this low-risk pregnancy and has Herpes genitalia; Surveillance for Depo-Provera contraception; Latex allergy; Acne; Encounter for supervision of normal first pregnancy in second trimester; and Maternal morbid obesity, antepartum (HCC) on their problem list.  Patient reports backache.  Contractions: Not present. Vag. Bleeding: None.   . Denies leaking of fluid.   The following portions of the patient's history were reviewed and updated as appropriate: allergies, current medications, past family history, past medical history, past social history, past surgical history and problem list.   Reviewed 28 week labs.   Objective:  There were no vitals filed for this visit. Self-Obtained Unable to find BP cuff due to belongings packed for moving, but if confident that she will find it within next week.  Fetal Status:           Po FM Assessment and Plan:  Pregnancy: G1P0 at [redacted]w[redacted]d 1. Encounter for supervision of normal first pregnancy in second trimester   2. Latex allergy   3. Maternal morbid obesity, antepartum (HCC) - Limit wt gain.   4. Back pain pregnancy - Comfort measures - PTL precautions - Rec Maternity support belt  Preterm labor symptoms and general obstetric precautions including but not limited to vaginal bleeding, contractions, leaking of fluid and fetal movement were reviewed in detail with the patient.  No follow-ups on file.  F/U 2 weeks Virtual Visit.    Time spent on virtual visit: 8 minutes  Dorathy Kinsman, [place]

## 2019-03-17 NOTE — Progress Notes (Signed)
Pt is on the phone preparing for virtual visit with provider. [redacted]w[redacted]d. Pt reports she has BP cuff but is unsure of where it is, she is in the process of moving and states it is in a box. Pt denies HA's or blurry vision.

## 2019-03-17 NOTE — Patient Instructions (Signed)
AREA PEDIATRIC/FAMILY PRACTICE PHYSICIANS  [place] ([address]) . [facility] o Chambliss, MD; Eniola, MD; Hale, MD; Hensel, MD; McDiarmid, MD; McIntyer, MD; Neal, MD; Walden, MD o [address] [place], [place] [address] o [phone] o Mon-Fri 8:30-12:30, 1:30-5:00 o Providers come to see babies at [facility] o Accepting Medicaid . [facility] o Limited providers who accept newborns: Koirala, MD; Morrow, MD; Wolters, MD o [address], [place], [place] [address] o [phone] o Mon-Fri 8:00-5:30 o Babies seen by providers at [facility] o Does NOT accept Medicaid o Please call early in hospitalization for appointment (limited availability)  . [facility] o Mulberry, MD o [address] [place], [place] [address] o [phone] o Mon, Tue, Thur, Fri 8:30-5:00, Wed 10:00-7:00 (closed 1-2pm) o Babies seen by [facility] providers o Accepting Medicaid . [facility] o Rubin, MD o [address], [place], [place] [address] o [phone] o Mon-Fri 8:30-5:00, Sat 8:30-12:00 o Provider comes to see babies at [facility] o Accepting Medicaid o Must have been referred from current patients or contacted office prior to delivery . [facility] ([facility]) o Brown, MD; Chandler, MD; Ettefagh, MD; Grant, MD; Lester, MD; McCormick, MD; McQueen, MD; Prose, MD; Simha, MD; Stanley, MD; Stryffeler, NP; Tebben, NP o [address], [place], [place] [address] o [phone] o Mon, Tue, Thur, Fri 8:30-5:30, Wed 9:30-5:30, Sat 8:30-12:30 o Babies seen by [facility] providers o Accepting Medicaid o Only accepting infants of first-time parents or siblings of current patients o Hospital discharge coordinator will make follow-up appointment . Jack Amos o [address],  [place], [place]  [address] o [phone]   Fax - [phone] . [facility] o [address], [place], [place]  [address] o Phone - [phone]   Fax - [phone] . Shilpa Gosrani o [address], [place], [place]  [address] o [phone]  [place] ([address]) . [facility] o Bates, MD; Brassfield, MD; Cooper, Cox, MD; MD; Davis, MD; Dovico, MD; Ettefaugh, MD; Little, MD; Lowe, MD; Keiffer, MD; Melvin, MD; Sumner, MD; Williams, MD o [address], [place], [place] [address] o [phone] o Mon-Fri 8:30-5:00 (extended evenings Mon-Thur as needed), Sat-Sun 10:00-1:00 o Providers come to see babies at [facility] o Accepting Medicaid for families of first-time babies and families with all children in the household age 3 and under. Must register with office prior to making appointment (M-F only). . [facility] o Henson, NP; Knapp, MD; Lalonde, MD; Tysinger, PA o [address] [place], [place] [address] o [phone] o Mon-Fri 8:00-5:00 o Babies seen by providers at [facility] o Does NOT accept Medicaid/Commercial Insurance Only . [facility] ([facility]  o Artis, MD; Barnes, MD; Bratton, MD; Coccaro, MD; Lockett Gardner, MD; Kramer, MD; Marshall, MD; Netherton, MD; Poleto, MD; Skinner, MD o [address] [place], [place] [address] o [phone] o Mon-Fri 8:30-5:30, Sat (Oct.-Mar.) 9:00-1:00 o Babies seen by providers at [facility] o Accepting Medicaid  [place] ([address]) . [facility] o Reid, MD; Warner, MD o [address], ,  [address] o [phone] o Mon-Fri 8:30-5:00, Sat 8:30-12:00 o Providers come to see babies at [facility] o Does NOT accept Medicaid . [facility] o Becker, PA; Hagler, MD; Scifres, PA; Sun, MD; Swayne, MD o [address],  [place], [place] [address] o [phone] o Mon-Fri 8:00-5:00 o Babies seen by providers at [facility] o Does NOT accept Medicaid o Only accepting babies of parents who are patients o Please call early in hospitalization for appointment (limited availability) . [facility] o Clark, MD; Frye, MD; Kelleher, MD; Mack, NP; Miller, MD; O'Keller, MD; Patterson, NP; Pudlo, MD; Puzio, MD; Thomas, MD; Tucker, MD; Twiselton, MD o [address], [place], [place] [address] o [phone] o Mon-Fri 8:00-5:00, Sat 9:00-12:00 o Providers come to see babies at [facility] o Does NOT accept Medicaid  [place] ([address]) . [facility] o Limited providers accepting new patients: Brake, NP; Wharton, PA o [address], [place], [place] [address] o [phone] o Mon-Fri 8:00-5:00 o Babies seen by providers at [facility] o Does NOT accept Medicaid o Only accepting babies of parents who are patients o Please call early in hospitalization for appointment (limited availability) . [facility] o Gay, MD; Quinlan, MD o [address] [place], [place] [address] o [phone] (press 1 to schedule appointment) o Mon-Fri 8:00-5:00 o Providers come to see babies at [facility] o Does NOT accept Medicaid . [facility] o Mazer, MD o [address] [place], [place] [address] o [phone] o Mon-Fri 8:30-5:00 (lunch 12:30-1:00), extended hours by appointment only Wed 5:00-6:30 o Babies seen by [facility] providers o Accepting Medicaid . [facility] o Banks, MD; Jordan, MD; Koberlein, MD o [address], [place], [place] [address] o [phone] o Mon-Fri 8:00-5:00 o Babies seen by [facility] providers o Does NOT accept Medicaid . [facility] o Parker, MD; Hunter, MD; Wallace, DO o [address] [place], [place]  [address] o [phone] o Mon-Fri 8:00-5:00 o Babies seen by [facility] providers o Does NOT accept Medicaid . [facility] o Brandon, PA; Brecken, PA; Christy, NP; Dees, MD; DeClaire, MD; DeWeese, MD; Hansen, NP; Mills, NP; Parrish, NP; Smoot, NP; Summer, MD; Vapne, MD o [address] [place], [place] [address] o [phone] o Mon-Fri 8:30-5:00, Sat 10:00-1:00 o Providers come to see babies at [facility] o Does NOT accept Medicaid o Free prenatal information session Tuesdays at 4:45pm . [facility] o Bouska, MD; Gordon, PA; Jeffery, PA; Weber, PA o [address] [place] [address] o [phone] o Mon-Fri 7:30-5:30 o Babies seen by [facility] providers . [facility] o [address], [place], [place]  [address] o [phone]   Fax - [phone]  [place] ([address] & [address]) . [facility] o [place]ese, [place] o [address] [place], [place] [address] o [phone] o Mon-Thur 8:00-6:00 o Providers come to see babies at [facility] o Accepting Medicaid . [facility] o Anderson, NP; Badger, MD; Beal, PA; Spencer, PA o [address] [place], [place] [address] o [phone] o Mon-Thur 7:30-7:30, Fri 7:30-4:30 o Babies seen by [facility] providers o Accepting Medicaid . [facility] o Agbuya, MD; Klett, NP; Romgoolam, MD o [address], [place], [place] [address] o [phone] o Mon-Fri 8:30-5:00, Sat 8:30-12:00 o Providers come to see babies at [facility] o Accepting Medicaid o Must have "Meet & Greet" appointment at office prior to delivery . [facility]  [facility] o McCord,   MD; Wallace, MD; Wood, MD o [address], [place], [place] [address] o [phone] o Mon-Wed 8:00-6:00, Thur-Fri 8:00-5:00, Sat 9:00-12:00 o Providers come to  see babies at [facility] o Does NOT accept Medicaid o Only accepting siblings of current patients . [facility]  o [address], [place], [place]  [address] o [phone]   Fax - [phone] . [facility] o [address], [place], [place]  [address] o [phone]   Fax - [phone]  [place] ([address] & [address]) . [facility] o Cirigliano, DO; Matthews, DO o [address] [place], [place] [address] o [phone] o Mon-Fri 7:00-5:00 o Babies seen by [facility] providers o Does NOT accept Medicaid . [facility] o Briscoe, MD; Howley, PA; Moreira, PA o [address], [place], [place] [address] o [phone] o Mon-Fri 8:00-5:00 o Babies seen by [facility] providers o Accepting Medicaid . [facility] o Boyd, MD; Church, PA; Jones, NP; Osborn, PA o [address], [place], [place] [address] o [phone] o Mon-Fri 8:00-5:00 o Babies seen by providers at [facility] o Accepting Medicaid  [place] ([address]) . [facility] [place] o Wendling, DO o [address] [place], [place] [address] o [phone] o Mon-Fri 8:00-5:00 o Babies seen by [facility] providers o Does NOT accept Medicaid o Limited availability, please call early in hospitalization to schedule follow-up . [facility] o Calderon, PA; Cummings, MD; Dillard, MD; Martin, PA; Olson, MD; VanDeven, PA o [address], [place], [place] [address] o [phone] o Mon-Fri 8:30-5:00, Sat 9:00-12:00 o Babies seen by providers at [facility] o Accepting Medicaid o Please register online then schedule online or call office o [email or web address] . [facility] o Hunter, NP; Kumar, MD; Martin Rogers, PA o [address], [place], [place] [address] o [phone] o Mon-Fri 8:00-5:00 o Babies seen by providers at [facility] o Accepting Medicaid . [facility] o Reedy, MD; Kristi Fleenor, NP; West, MD o [address], [place], [place] [address] o [phone] o Mon-Fri 8:00-5:30, Sat&Sun by appointment (phones open at 8:30) o Babies seen by [facility] providers o Accepting Medicaid o Must be a first-time baby or sibling of current patient . [facility] [place]  o [address], [place], [place]  [address] o [phone]   Fax - [phone]  [place] ([address]) . [facility] o Brown, PA; Cowen, PA; Rice, MD; Helton, PA; Spry, MD o [address] [place], [place] [address] o [phone] o Mon-Thur 8:00-7:00, Fri 8:00-5:00, Sat 8:00-12:00, Sun 9:00-12:00 o Babies seen by [facility] providers o Accepting Medicaid . [facility] [place] o Coe-Goins, MD; Marshall, MD; Pierre-Louis, MD o [address] [place] [address], [place],  [address] o [phone] o Mon-Thur 8:00-5:00 o Babies seen by providers at [facility] o Accepting Medicaid . [facility] o Bratton, MD; Coe-Goins, MD; Hayes, MD; Lewis, MD; List, MD; Lott, MD; Marshall, MD; Moran, MD; O'Neal, MD; Pierre-Louis, MD; Pitonzo, MD; Scholer, MD; Spangle, MD o [address] [place],    [address] o [phone] o Mon-Fri 8:00-5:30, Sat (Oct.-Mar.) 9:00-1:00 o Babies seen by providers at [facility] o Accepting Medicaid o Must fill out new patient packet, available online at [email or web address] . [facility] ([facility] o Friddle, NP; Harris, NP; Kelly, NP; Logan, MD;  Melvin, PA; Poth, MD; Ramadoss, MD; Stanton, NP o [address], [place], [place] [address] o [phone] o Mon-Thur 8:00-5:30, Fri 8:00-5:00 o Babies seen by providers at [facility] o Accepting Medicaid  [place] ([address]) . [facility] o Dixon, PA; Elwood, MD; Pickard, MD; Tapia, PA o [address], [place], [place] [address] o [phone] o Mon-Fri 8:00-5:00 o Babies seen by providers at [facility] o Accepting Medicaid   [place] ([address]) . [facility] [place] o Masneri, DO; Meyers, MD; Nelson, PA o [address], [place], [place] [address] o [phone] o Mon-Fri 8:00-5:00 o Babies seen by providers at [facility] o Does NOT accept Medicaid o Limited appointment availability, please call early in hospitalization  . [facility] [place] o Kunedd, DO; McGowen, MD o [address], [place], [place] [address] o [phone] o Mon-Fri 8:00-5:00 o Babies seen by [facility] providers o Does NOT accept Medicaid . [facility] [place] o Cameron, MD; MacDonald, MD; Michaels, PA; Nayak, MD o [address] [place] [address], [place], [place] [address] o [phone] o Mon-Fri 8:00-5:00 o After hours clinic ([address] [place], [place] [address]) [phone] Mon-Fri 5:00-8:00, Sat 12:00-6:00, Sun 10:00-4:00 o Babies seen by [facility] providers o Accepting Medicaid . [facility] [place] o [address], [place], [place]  [address] o [phone]   Fax - [phone]  [place] ([address]) . [facility] [place] [facility] o Andy, MD o [address], [place], [place] [address] o [phone] o Mon-Fri 8:00-5:00 o Babies seen by [facility] providers o Does NOT accept Medicaid . [facility] [place] ([facility] [place]) o Eksir, MD o [address], [place], [place]  [address] o [phone] o Mon-Thur 8:00-7:00, Fri 8:00-5:00, Sat 8:00-12:00 o Babies seen by providers at [facility] o Accepting Medicaid - but does not have vaccinations in office (must be received elsewhere) o Limited availability, please call early in hospitalization  [place] ([address]) . [facility]  o Charlene Flemming, MD o [address], [place] [address] o [phone]  Fax [phone]   

## 2019-03-20 ENCOUNTER — Telehealth: Payer: Self-pay

## 2019-03-20 ENCOUNTER — Other Ambulatory Visit: Payer: Self-pay | Admitting: Advanced Practice Midwife

## 2019-03-20 MED ORDER — METOCLOPRAMIDE HCL 10 MG PO TABS: 10.0000 mg | ORAL_TABLET | Freq: Four times a day (QID) | ORAL | 5 refills | Status: DC | PRN

## 2019-03-20 MED ORDER — COMFORT FIT MATERNITY SUPP MED MISC: 1.0000 | Freq: Every day | 0 refills | Status: DC

## 2019-03-20 NOTE — Telephone Encounter (Signed)
Return call to pt regarding concerns Pt works at [employer] and states she is on her feet 8-9 hrs allowed 1 30 min break  The A/C is currently not working pt notes she gets dizzy while working, swollen ankles and back pain and the smell of the food keeps her nauseated pt would like refill on Reglan  Pt asked if she could be taken out of work with these complaints. I did make her aware we could provide a restriction letter and I would consult with a provider.  Pt voiced understanding  Please advise.

## 2019-03-21 ENCOUNTER — Telehealth: Payer: Self-pay

## 2019-03-21 ENCOUNTER — Encounter: Payer: Self-pay | Admitting: *Deleted

## 2019-03-22 NOTE — Telephone Encounter (Signed)
Error staff message was sent to provider regarding pt concerns.

## 2019-03-23 ENCOUNTER — Encounter: Payer: Self-pay | Admitting: Obstetrics

## 2019-03-23 ENCOUNTER — Encounter: Payer: Self-pay | Admitting: *Deleted

## 2019-03-24 ENCOUNTER — Other Ambulatory Visit: Payer: Self-pay

## 2019-03-24 ENCOUNTER — Encounter: Payer: Self-pay | Admitting: Obstetrics

## 2019-03-24 ENCOUNTER — Encounter: Payer: Self-pay | Admitting: *Deleted

## 2019-03-24 ENCOUNTER — Ambulatory Visit (INDEPENDENT_AMBULATORY_CARE_PROVIDER_SITE_OTHER): Payer: Medicaid Other | Admitting: Obstetrics

## 2019-03-24 VITALS — BP 138/77 | HR 107

## 2019-03-24 DIAGNOSIS — O26893 Other specified pregnancy related conditions, third trimester: Secondary | ICD-10-CM

## 2019-03-24 DIAGNOSIS — Z3A31 31 weeks gestation of pregnancy: Secondary | ICD-10-CM

## 2019-03-24 DIAGNOSIS — M549 Dorsalgia, unspecified: Secondary | ICD-10-CM

## 2019-03-24 DIAGNOSIS — O99213 Obesity complicating pregnancy, third trimester: Secondary | ICD-10-CM

## 2019-03-24 DIAGNOSIS — Z3400 Encounter for supervision of normal first pregnancy, unspecified trimester: Secondary | ICD-10-CM

## 2019-03-24 DIAGNOSIS — O99210 Obesity complicating pregnancy, unspecified trimester: Secondary | ICD-10-CM

## 2019-03-24 MED ORDER — VITAFOL ULTRA 29-0.6-0.4-200 MG PO CAPS: 1.0000 | ORAL_CAPSULE | Freq: Every day | ORAL | 4 refills | Status: DC

## 2019-03-24 NOTE — Progress Notes (Signed)
Pt presents for webex visit. Pt identified with 2 pt identifiers. She is currently [redacted]w[redacted]d. Her BP today is 141/81. Second attempt is 138/77. Pt denies having any headache, visual changes, or epigastric pain. Pt does not have any concerns.

## 2019-03-24 NOTE — Progress Notes (Signed)
TELEHEALTH OBSTETRICS PRENATAL VIRTUAL VIDEO VISIT ENCOUNTER NOTE  Provider location: Center for [facility] [place]   I connected with [name] on 03/24/19 at  9:00 AM EDT by WebEx OB MyChart Video Encounter at home and verified that I am speaking with the correct person using two identifiers.   I discussed the limitations, risks, security and privacy concerns of performing an evaluation and management service by telephone and the availability of in person appointments. I also discussed with the patient that there may be a patient responsible charge related to this service. The patient expressed understanding and agreed to proceed. Subjective:  Taylor Reed is a 23 y.o. G1P0 at [redacted]w[redacted]d being seen today for ongoing prenatal care.  She is currently monitored for the following issues for this low-risk pregnancy and has Herpes genitalia; Surveillance for Depo-Provera contraception; Latex allergy; Acne; Encounter for supervision of normal first pregnancy in second trimester; and Maternal morbid obesity, antepartum (HCC) on their problem list.  Patient reports backache.  Contractions: Not present. Vag. Bleeding: None.  Movement: Present. Denies any leaking of fluid.   The following portions of the patient's history were reviewed and updated as appropriate: allergies, current medications, past family history, past medical history, past social history, past surgical history and problem list.   Objective:   Vitals:   03/24/19 0905 03/24/19 0912  BP: (!) 141/81 138/77  Pulse: (!) 111 (!) 107    Fetal Status:     Movement: Present     General:  Alert, oriented and cooperative. Patient is in no acute distress.  Respiratory: Normal respiratory effort, no problems with respiration noted  Mental Status: Normal mood and affect. Normal behavior. Normal judgment and thought content.  Rest of physical exam deferred due to type of encounter  Imaging: [place] Mfm Ob Follow Up  Result  Date: 03/03/2019 ----------------------------------------------------------------------  OBSTETRICS REPORT                        (Signed Final 03/03/2019 06:28 am) ---------------------------------------------------------------------- Patient Info  ID #:       [id number]                          D.O.B.:  Feb 19, 1996 (22 yrs)  Name:       Taylor Reed               Visit Date: 03/02/2019 09:44 am ---------------------------------------------------------------------- Performed By  Performed By:     Sandi Mealy        Ref. Address:      [address]                                                              [address] [phone]  [place]                                                              [address]  Attending:        Lin Landsman      [facility]                                        [facility]  Referred By:      [facility] ---------------------------------------------------------------------- Orders   #  Description                          Code         Ordered By   1  [place] MFM OB FOLLOW UP                  [phone]     [facility]  ----------------------------------------------------------------------   #  Order #                    Accession #                 Episode #   1  [id number]                  [id number]                  [id number]  ---------------------------------------------------------------------- Indications   Encounter for other antenatal screening        Z36.2   follow-up (Low Risk NIPS, Neg AFP)   [redacted] weeks gestation of pregnancy                Z3A.28  ---------------------------------------------------------------------- Vital Signs  Weight (lb): 232                               Height:        5'4"  BMI:         39.82 ----------------------------------------------------------------------  Fetal Evaluation  Num Of Fetuses:          1  Fetal Heart Rate(bpm):   140  Cardiac Activity:        Observed  Presentation:            Cephalic  Placenta:                Anterior  P. Cord Insertion:       Previously Visualized  Amniotic Fluid  AFI FV:      Within normal limits  AFI Sum(cm)     %Tile       Largest Pocket(cm)  13.64           42          4.98  RUQ(cm)       RLQ(cm)       LUQ(cm)        LLQ(cm)  2.43  2.85          3.38           4.98 ---------------------------------------------------------------------- Biometry  BPD:      65.8  mm     G. Age:  26w 4d          4  %    CI:        68.33   %    70 - 86                                                          FL/HC:       22.6  %    18.8 - 20.6  HC:      254.5  mm     G. Age:  27w 5d         10  %    HC/AC:       1.02       1.05 - 1.21  AC:      248.6  mm     G. Age:  29w 0d         71  %    FL/BPD:      87.4  %    71 - 87  FL:       57.5  mm     G. Age:  30w 1d         86  %    FL/AC:       23.1  %    20 - 24  HUM:      51.9  mm     G. Age:  30w 2d         91  %  Est. FW:    1334   gm   2 lb 15 oz      70  % ---------------------------------------------------------------------- OB History  Gravidity:    1         Term:   0        Prem:   0        SAB:   0  TOP:          0       Ectopic:  0        Living: 0 ---------------------------------------------------------------------- Gestational Age  LMP:           28w 1d        Date:  08/17/18                 EDD:   05/24/19  U/S Today:     28w 3d                                        EDD:   05/22/19  Best:          28w 1d     Det. By:  LMP  (08/17/18)          EDD:   05/24/19 ---------------------------------------------------------------------- Anatomy  Cranium:               Appears normal         Aortic Arch:  Previously seen  Cavum:                 Appears normal         Ductal Arch:            Previously seen  Ventricles:            Appears normal         Diaphragm:               Appears normal  Choroid Plexus:        Previously seen        Stomach:                Appears normal, left                                                                        sided  Cerebellum:            Previously seen        Abdomen:                Appears normal  Posterior Fossa:       Previously seen        Abdominal Wall:         Previously seen  Nuchal Fold:           Previously seen        Cord Vessels:           Previously seen  Face:                  Orbits and profile     Kidneys:                Appear normal                         previously seen  Lips:                  Previously seen        Bladder:                Appears normal  Thoracic:              Appears normal         Spine:                  Ltd views no                                                                        intracranial signs of  NTD  Heart:                 Previously seen        Upper Extremities:      Appears normal  RVOT:                  Appears normal         Lower Extremities:      Appears normal  LVOT:                  Appears normal  Other:  Fetus appears to be a female. Heels visualized. ---------------------------------------------------------------------- Cervix Uterus Adnexa  Cervix  Not visualized (advanced GA >24wks) ---------------------------------------------------------------------- Impression  Normal interval growth.  Limited spine views seen again today however, intracranial  anatomy, coronal and transverse spine appear normal. ---------------------------------------------------------------------- Recommendations  Follow up as clinically indicated. ----------------------------------------------------------------------               Lin Landsman, MD Electronically Signed Final Report   03/03/2019 06:28 am ----------------------------------------------------------------------   Assessment and Plan:  Pregnancy: G1P0 at [redacted]w[redacted]d  1.  Supervision of normal first pregnancy, antepartum Rx: - Prenat-Fe Poly-Methfol-FA-DHA (VITAFOL ULTRA) 29-0.6-0.4-200 MG CAPS; Take 1 capsule by mouth daily before breakfast.  Dispense: 90 capsule; Refill: 4  2. Backache symptom - having significant discomfort at work with a constant pace and not being able to take the necessary breaks because of the nature of her job.  She has elected to to start maternity leave, and I agree.  She is coming to the office today to be fitted for a [facility], and I have encouraged her to rest more and take Tylenol prn for the back discomfort and pelvic pressure.  3. Obesity affecting pregnancy, antepartum   Preterm labor symptoms and general obstetric precautions including but not limited to vaginal bleeding, contractions, leaking of fluid and fetal movement were reviewed in detail with the patient. I discussed the assessment and treatment plan with the patient. The patient was provided an opportunity to ask questions and all were answered. The patient agreed with the plan and demonstrated an understanding of the instructions. The patient was advised to call back or seek an in-person office evaluation/go to MAU at [facility] for any urgent or concerning symptoms. Please refer to After Visit Summary for other counseling recommendations.   I provided 10 minutes of face-to-face time during this encounter.  Return in about 2 weeks (around 04/07/2019) for [facility].  Future Appointments  Date Time Provider Department Center  03/31/2019 11:15 AM Gerrit Heck, CNM [facility] None    Coral Ceo, MD Center for [facility], [facility] 03-24-2019

## 2019-04-05 ENCOUNTER — Encounter: Payer: Self-pay | Admitting: Certified Nurse Midwife

## 2019-04-05 ENCOUNTER — Ambulatory Visit (INDEPENDENT_AMBULATORY_CARE_PROVIDER_SITE_OTHER): Payer: Medicaid Other | Admitting: Certified Nurse Midwife

## 2019-04-05 VITALS — BP 151/83 | HR 94

## 2019-04-05 DIAGNOSIS — Z3A33 33 weeks gestation of pregnancy: Secondary | ICD-10-CM

## 2019-04-05 DIAGNOSIS — O139 Gestational [pregnancy-induced] hypertension without significant proteinuria, unspecified trimester: Secondary | ICD-10-CM | POA: Insufficient documentation

## 2019-04-05 DIAGNOSIS — Z3403 Encounter for supervision of normal first pregnancy, third trimester: Secondary | ICD-10-CM

## 2019-04-05 DIAGNOSIS — A6000 Herpesviral infection of urogenital system, unspecified: Secondary | ICD-10-CM

## 2019-04-05 DIAGNOSIS — O133 Gestational [pregnancy-induced] hypertension without significant proteinuria, third trimester: Secondary | ICD-10-CM

## 2019-04-05 HISTORY — DX: Gestational (pregnancy-induced) hypertension without significant proteinuria, unspecified trimester: O13.9

## 2019-04-05 NOTE — Progress Notes (Signed)
   [place] VIRTUAL VIDEO VISIT ENCOUNTER NOTE  Provider location: Center for [place] at [place]   I connected with [name] on 04/05/19 at 10:42 AM EDT by WebEx Video Encounter at home and verified that I am speaking with the correct person using two identifiers.   I discussed the limitations, risks, security and privacy concerns of performing an evaluation and management service by telephone and the availability of in person appointments. I also discussed with the patient that there may be a patient responsible charge related to this service. The patient expressed understanding and agreed to proceed. Subjective:  Taylor Reed is a 23 y.o. G1P0 at [redacted]w[redacted]d being seen today for ongoing prenatal care.  She is currently monitored for the following issues for this low-risk pregnancy and has Herpes genitalia; Surveillance for Depo-Provera contraception; Latex allergy; Acne; Encounter for supervision of normal first pregnancy in second trimester; and Maternal morbid obesity, antepartum ([place]) on their problem list.  Patient reports no complaints.  Contractions: Not present. Vag. Bleeding: None.  Movement: Present. Denies any leaking of fluid.   The following portions of the patient's history were reviewed and updated as appropriate: allergies, current medications, past family history, past medical history, past social history, past surgical history and problem list.   Objective:   Vitals:   04/05/19 1022  BP: (!) 151/83  Pulse: 94    Fetal Status:     Movement: Present     General:  Alert, oriented and cooperative. Patient is in no acute distress.  Respiratory: Normal respiratory effort, no problems with respiration noted  Mental Status: Normal mood and affect. Normal behavior. Normal judgment and thought content.  Rest of physical exam deferred due to type of encounter  Imaging: No results found.  Assessment and Plan:  Pregnancy: G1P0 at [redacted]w[redacted]d  1. Encounter for supervision of normal first pregnancy in third trimester - Patient doing well, no complaints  - BP elevated today, diagnosed with GHTN  -Anticipatory guidance on upcoming appointments   2. Genital herpes simplex, unspecified site - Suppression at 36 weeks   3. Gestational Hypertension, third trimester  - BP today 151/83, repeat 95/60 - Denies HA, vision changes or RUQ pain  - Preeclampsia precautions given, discussed with patient coming to office this week for lab work  - Patient unable to come to office or hospital d/t use of medicaid transportation, office lab work appointment made for 04/10/2019  Preterm labor symptoms and general obstetric precautions including but not limited to vaginal bleeding, contractions, leaking of fluid and fetal movement were reviewed in detail with the patient. I discussed the assessment and treatment plan with the patient. The patient was provided an opportunity to ask questions and all were answered. The patient agreed with the plan and demonstrated an understanding of the instructions. The patient was advised to call back or seek an in-person office evaluation/go to MAU at [facility] for any urgent or concerning symptoms. Please refer to After Visit Summary for other counseling recommendations.   I provided 15 minutes of face-to-face time during this encounter.  Return in about 3 weeks (around 04/26/2019) for [facility].  Future Appointments  Date Time Provider [place]  04/10/2019 11:00 AM [place], [place] for [place]

## 2019-04-07 ENCOUNTER — Telehealth: Payer: Self-pay

## 2019-04-07 NOTE — Telephone Encounter (Signed)
Patient's mother called and left a message on triage vm requesting a rx for patient. I called patient back to discuss sx and treatment. Patient did not answer. LM on VM for patient to return call

## 2019-04-10 ENCOUNTER — Other Ambulatory Visit: Payer: Medicaid Other

## 2019-04-10 ENCOUNTER — Other Ambulatory Visit: Payer: Self-pay

## 2019-04-10 DIAGNOSIS — O133 Gestational [pregnancy-induced] hypertension without significant proteinuria, third trimester: Secondary | ICD-10-CM

## 2019-04-10 DIAGNOSIS — Z7689 Persons encountering health services in other specified circumstances: Secondary | ICD-10-CM | POA: Diagnosis not present

## 2019-04-11 LAB — COMPREHENSIVE METABOLIC PANEL
ALT: 20 IU/L (ref 0–32)
AST: 22 IU/L (ref 0–40)
Albumin/Globulin Ratio: 1.2 (ref 1.2–2.2)
Albumin: 3.3 g/dL — ABNORMAL LOW (ref 3.9–5.0)
Alkaline Phosphatase: 72 IU/L (ref 39–117)
BUN/Creatinine Ratio: 12 (ref 9–23)
BUN: 8 mg/dL (ref 6–20)
Bilirubin Total: <0.2 mg/dL (ref 0.0–1.2)
CO2: 17 mmol/L — ABNORMAL LOW (ref 20–29)
Calcium: 9.8 mg/dL (ref 8.7–10.2)
Chloride: 107 mmol/L — ABNORMAL HIGH (ref 96–106)
Creatinine, Ser: 0.65 mg/dL (ref 0.57–1.00)
GFR calc Af Amer: 146 mL/min/{1.73_m2} (ref >59)
GFR calc non Af Amer: 126 mL/min/{1.73_m2} (ref >59)
Globulin, Total: 2.8 g/dL (ref 1.5–4.5)
Glucose: 104 mg/dL — ABNORMAL HIGH (ref 65–99)
Potassium: 4.3 mmol/L (ref 3.5–5.2)
Sodium: 139 mmol/L (ref 134–144)
Total Protein: 6.1 g/dL (ref 6.0–8.5)

## 2019-04-11 LAB — PROTEIN / CREATININE RATIO, URINE
Creatinine, Urine: 165.6 mg/dL
Protein, Ur: 30.7 mg/dL
Protein/Creat Ratio: 185 mg/g creat (ref 0–200)

## 2019-04-11 LAB — CBC
Hematocrit: 33.2 % — ABNORMAL LOW (ref 34.0–46.6)
Hemoglobin: 10.5 g/dL — ABNORMAL LOW (ref 11.1–15.9)
MCH: 28.5 pg (ref 26.6–33.0)
MCHC: 31.6 g/dL (ref 31.5–35.7)
MCV: 90 fL (ref 79–97)
Platelets: 268 10*3/uL (ref 150–450)
RBC: 3.69 x10E6/uL — ABNORMAL LOW (ref 3.77–5.28)
RDW: 14.1 % (ref 11.7–15.4)
WBC: 11.4 10*3/uL — ABNORMAL HIGH (ref 3.4–10.8)

## 2019-04-19 ENCOUNTER — Other Ambulatory Visit: Payer: Self-pay

## 2019-04-19 ENCOUNTER — Ambulatory Visit (INDEPENDENT_AMBULATORY_CARE_PROVIDER_SITE_OTHER): Payer: Medicaid Other | Admitting: Certified Nurse Midwife

## 2019-04-19 ENCOUNTER — Other Ambulatory Visit (HOSPITAL_COMMUNITY)
Admission: RE | Admit: 2019-04-19 | Discharge: 2019-04-19 | Disposition: A | Discharge status: Home / Self Care | Payer: Medicaid Other | Source: Ambulatory Visit | Attending: Certified Nurse Midwife | Admitting: Certified Nurse Midwife

## 2019-04-19 ENCOUNTER — Encounter: Payer: Self-pay | Admitting: Certified Nurse Midwife

## 2019-04-19 VITALS — BP 126/80 | HR 112 | Temp 98.5°F | Wt 243.0 lb

## 2019-04-19 DIAGNOSIS — Z3A35 35 weeks gestation of pregnancy: Secondary | ICD-10-CM

## 2019-04-19 DIAGNOSIS — Z3402 Encounter for supervision of normal first pregnancy, second trimester: Secondary | ICD-10-CM | POA: Diagnosis not present

## 2019-04-19 DIAGNOSIS — A6000 Herpesviral infection of urogenital system, unspecified: Secondary | ICD-10-CM

## 2019-04-19 DIAGNOSIS — B373 Candidiasis of vulva and vagina: Secondary | ICD-10-CM

## 2019-04-19 DIAGNOSIS — O98913 Unspecified maternal infectious and parasitic disease complicating pregnancy, third trimester: Secondary | ICD-10-CM

## 2019-04-19 DIAGNOSIS — B3731 Acute candidiasis of vulva and vagina: Secondary | ICD-10-CM

## 2019-04-19 DIAGNOSIS — O133 Gestational [pregnancy-induced] hypertension without significant proteinuria, third trimester: Secondary | ICD-10-CM

## 2019-04-19 LAB — OB RESULTS CONSOLE GBS: GBS: POSITIVE

## 2019-04-19 MED ORDER — VALACYCLOVIR HCL 500 MG PO TABS: 500.0000 mg | ORAL_TABLET | Freq: Two times a day (BID) | ORAL | 0 refills | Status: DC

## 2019-04-19 NOTE — Patient Instructions (Signed)
Labor Induction  Labor induction is when steps are taken to cause a pregnant woman to begin the labor process. Most women go into labor on their own between 37 weeks and 42 weeks of pregnancy. When this does not happen or when there is a medical need for labor to begin, steps may be taken to induce labor. Labor induction causes a pregnant woman's uterus to contract. It also causes the cervix to soften (ripen), open (dilate), and thin out (efface). Usually, labor is not induced before 39 weeks of pregnancy unless there is a medical reason to do so. Your health care provider will determine if labor induction is needed. Before inducing labor, your health care provider will consider a number of factors, including:  Your medical condition and your baby's.  How many weeks along you are in your pregnancy.  How mature your baby's lungs are.  The condition of your cervix.  The position of your baby.  The size of your birth canal. What are some reasons for labor induction? Labor may be induced if:  Your health or your baby's health is at risk.  Your pregnancy is overdue by 1 week or more.  Your water breaks but labor does not start on its own.  There is a low amount of amniotic fluid around your baby. You may also choose (elect) to have labor induced at a certain time. Generally, elective labor induction is done no earlier than 39 weeks of pregnancy. What methods are used for labor induction? Methods used for labor induction include:  Prostaglandin medicine. This medicine starts contractions and causes the cervix to dilate and ripen. It can be taken by mouth (orally) or by being inserted into the vagina (suppository).  Inserting a small, thin tube (catheter) with a balloon into the vagina and then expanding the balloon with water to dilate the cervix.  Stripping the membranes. In this method, your health care provider gently separates amniotic sac tissue from the cervix. This causes the  cervix to stretch, which in turn causes the release of a hormone called progesterone. The hormone causes the uterus to contract. This procedure is often done during an office visit, after which you will be sent home to wait for contractions to begin.  Breaking the water. In this method, your health care provider uses a small instrument to make a small hole in the amniotic sac. This eventually causes the amniotic sac to break. Contractions should begin after a few hours.  Medicine to trigger or strengthen contractions. This medicine is given through an IV that is inserted into a vein in your arm. Except for membrane stripping, which can be done in a clinic, labor induction is done in the hospital so that you and your baby can be carefully monitored. How long does it take for labor to be induced? The length of time it takes to induce labor depends on how ready your body is for labor. Some inductions can take up to 2-3 days, while others may take less than a day. Induction may take longer if:  You are induced early in your pregnancy.  It is your first pregnancy.  Your cervix is not ready. What are some risks associated with labor induction? Some risks associated with labor induction include:  Changes in fetal heart rate, such as being too high, too low, or irregular (erratic).  Failed induction.  Infection in the mother or the baby.  Increased risk of having a cesarean delivery.  Fetal death.  Breaking off (abruption)   of the placenta from the uterus (rare).  Rupture of the uterus (very rare). When induction is needed for medical reasons, the benefits of induction generally outweigh the risks. What are some reasons for not inducing labor? Labor induction should not be done if:  Your baby does not tolerate contractions.  You have had previous surgeries on your uterus, such as a myomectomy, removal of fibroids, or a vertical scar from a previous cesarean delivery.  Your placenta lies  very low in your uterus and blocks the opening of the cervix (placenta previa).  Your baby is not in a head-down position.  The umbilical cord drops down into the birth canal in front of the baby.  There are unusual circumstances, such as the baby being very early (premature).  You have had more than 2 previous cesarean deliveries. Summary  Labor induction is when steps are taken to cause a pregnant woman to begin the labor process.  Labor induction causes a pregnant woman's uterus to contract. It also causes the cervix to ripen, dilate, and efface.  Labor is not induced before 39 weeks of pregnancy unless there is a medical reason to do so.  When induction is needed for medical reasons, the benefits of induction generally outweigh the risks. This information is not intended to replace advice given to you by your health care provider. Make sure you discuss any questions you have with your health care provider. Document Released: 02/24/2007 Document Revised: 10/08/2017 Document Reviewed: 11/18/2016 Elsevier Patient Education  2020 [employer]   Hypertension During Pregnancy Hypertension is also called high blood pressure. High blood pressure means that the force of your blood moving in your body is too strong. It can cause problems for you and your baby. Different types of high blood pressure can happen during pregnancy. The types are:  High blood pressure before you got pregnant. This is called chronic hypertension.  This can continue during your pregnancy. Your doctor will want to keep checking your blood pressure. You may need medicine to keep your blood pressure under control while you are pregnant. You will need follow-up visits after you have your baby.  High blood pressure that goes up during pregnancy when it was normal before. This is called gestational hypertension. It will usually get better after you have your baby, but your doctor will need to watch your blood pressure to  make sure that it is getting better.  Very high blood pressure during pregnancy. This is called preeclampsia. Very high blood pressure is an emergency that needs to be checked and treated right away.  You may develop very high blood pressure after giving birth. This is called postpartum preeclampsia. This usually occurs within 48 hours after childbirth but may occur up to 6 weeks after giving birth. This is rare. How does this affect me? If you have high blood pressure during pregnancy, you have a higher chance of developing high blood pressure:  As you get older.  If you get pregnant again. In some cases, high blood pressure during pregnancy can cause:  Stroke.  Heart attack.  Damage to the kidneys, lungs, or liver.  Preeclampsia.  Jerky movements you cannot control (convulsions or seizures).  Problems with the placenta. How does this affect my baby? Your baby may:  Be born early.  Not weigh as much as he or she should.  Not handle labor well, leading to a c-section birth. What are the risks?  Having high blood pressure during a past pregnancy.  Being  overweight.  Being 760 years old or older.  Being pregnant for the first time.  Being pregnant with more than one baby.  Becoming pregnant using fertility methods, such as IVF.  Having other problems, such as diabetes, or kidney disease.  Having family members who have high blood pressure. What can I do to lower my risk?   Keep a healthy weight.  Eat a healthy diet.  Follow what your doctor tells you about treating any medical problems that you had before becoming pregnant. It is very important to go to all of your doctor visits. Your doctor will check your blood pressure and make sure that your pregnancy is progressing as it should. Treatment should start early if a problem is found. How is this treated? Treatment for high blood pressure during pregnancy can differ depending on the type of high blood pressure  you have and how serious it is.  You may need to take blood pressure medicine.  If you have been taking medicine for your blood pressure, you may need to change the medicine during pregnancy if it is not safe for your baby.  If your doctor thinks that you could get very high blood pressure, he or she may tell you to take a low-dose aspirin during your pregnancy.  If you have very high blood pressure, you may need to stay in the hospital so you and your baby can be watched closely. You may also need to take medicine to lower your blood pressure. This medicine may be given by mouth or through an IV tube.  In some cases, if your condition gets worse, you may need to have your baby early. Follow these instructions at home: Eating and drinking   Drink enough fluid to keep your pee (urine) pale yellow.  Avoid caffeine. Lifestyle  Do not use any products that contain nicotine or tobacco, such as cigarettes, e-cigarettes, and chewing tobacco. If you need help quitting, ask your doctor.  Do not use alcohol or drugs.  Avoid stress.  Rest and get plenty of sleep.  Regular exercise can help. Ask your doctor what kinds of exercise are best for you. General instructions  Take over-the-counter and prescription medicines only as told by your doctor.  Keep all prenatal and follow-up visits as told by your doctor. This is important. Contact a doctor if:  You have symptoms that your doctor told you to watch for, such as: ? Headaches. ? Nausea. ? Vomiting. ? Belly (abdominal) pain. ? Dizziness. ? Light-headedness. Get help right away if:  You have: ? Very bad belly pain that does not get better with treatment. ? A very bad headache that does not get better. ? Vomiting that does not get better. ? Sudden, fast weight gain. ? Sudden swelling in your hands, ankles, or face. ? Bleeding from your vagina. ? Blood in your pee. ? Blurry vision. ? Double vision. ? Shortness of breath. ?  Chest pain. ? Weakness on one side of your body. ? Trouble talking.  Your baby is not moving as much as usual. Summary  High blood pressure is also called hypertension.  High blood pressure means that the force of your blood moving in your body is too strong.  High blood pressure can cause problems for you and your baby.  Keep all follow-up visits as told by your doctor. This is important. This information is not intended to replace advice given to you by your health care provider. Make sure you discuss any questions  you have with your health care provider. Document Released: 11/07/2010 Document Revised: 01/26/2019 Document Reviewed: 11/01/2018 Elsevier Patient Education  2020 [employer].

## 2019-04-19 NOTE — Progress Notes (Signed)
   PRENATAL VISIT NOTE  Subjective:  Taylor Reed is a 23 y.o. G1P0 at [redacted]w[redacted]d being seen today for ongoing prenatal care.  She is currently monitored for the following issues for this high-risk pregnancy and has Herpes genitalia; Surveillance for Depo-Provera contraception; Latex allergy; Acne; Encounter for supervision of normal first pregnancy in second trimester; Maternal morbid obesity, antepartum ([place]); and Gestational hypertension on their problem list.  Patient reports no complaints.  Contractions: Irritability. Vag. Bleeding: None.  Movement: Present. Denies leaking of fluid.   The following portions of the patient's history were reviewed and updated as appropriate: allergies, current medications, past family history, past medical history, past social history, past surgical history and problem list.   Objective:   Vitals:   04/19/19 1307  BP: 126/80  Pulse: (!) 112  Temp: 98.5 F (36.9 C)  Weight: 243 lb (110.2 kg)    Fetal Status: Fetal Heart Rate (bpm): 142 Fundal Height: 38 cm Movement: Present  Presentation: Vertex  General:  Alert, oriented and cooperative. Patient is in no acute distress.  Skin: Skin is warm and dry. No rash noted.   Cardiovascular: Normal heart rate noted  Respiratory: Normal respiratory effort, no problems with respiration noted  Abdomen: Soft, gravid, appropriate for gestational age.  Pain/Pressure: Present     Pelvic: Cervical exam performed Dilation: Closed Effacement (%): 50 Station: -3  Extremities: Normal range of motion.  Edema: Trace  Mental Status: Normal mood and affect. Normal behavior. Normal judgment and thought content.   Assessment and Plan:  Pregnancy: G1P0 at [redacted]w[redacted]d 1. Encounter for supervision of normal first pregnancy in second trimester - Patient doing well, no complaints - Anticipatory guidance on upcoming appointments with Mychart visit for next appointment  - Routine prenatal care - Culture, beta strep (group b only) -  Cervicovaginal ancillary only( [place])  2. Gestational hypertension, third trimester - BP stable today  - IOL scheduled for 7/15 @ [facility], orders placed  - s/s of PEC discussed and reasons to go to MAU for evaluation  - Patient denies s/s PEC today , PEC labs done on 6/22 negative   3. Genital herpes simplex, unspecified site - Rx for suppression sent to pharmacy on file  - valACYclovir (VALTREX) 500 MG tablet; Take 1 tablet (500 mg total) by mouth 2 (two) times daily.  Dispense: 60 tablet; Refill: 0  Preterm labor symptoms and general obstetric precautions including but not limited to vaginal bleeding, contractions, leaking of fluid and fetal movement were reviewed in detail with the patient. Please refer to After Visit Summary for other counseling recommendations.   Return in about 1 week (around 04/26/2019) for ROB-mychart visit.  Future Appointments  Date Time Provider [place]  04/26/2019 11:00 AM Woodroe Mode, MD [place] None  05/03/2019 12:00 AM [facility] [place] None    Lajean Manes, CNM

## 2019-04-20 LAB — CERVICOVAGINAL ANCILLARY ONLY
Bacterial vaginitis: NEGATIVE
Candida vaginitis: POSITIVE — AB
Chlamydia: NEGATIVE
Neisseria Gonorrhea: NEGATIVE

## 2019-04-22 LAB — CULTURE, BETA STREP (GROUP B ONLY): Strep Gp B Culture: POSITIVE — AB

## 2019-04-24 ENCOUNTER — Telehealth (HOSPITAL_COMMUNITY): Payer: Self-pay | Admitting: *Deleted

## 2019-04-24 MED ORDER — FLUCONAZOLE 150 MG PO TABS: 150.0000 mg | ORAL_TABLET | Freq: Every day | ORAL | 0 refills | Status: DC

## 2019-04-24 NOTE — Addendum Note (Signed)
Addended by: Lajean Manes on: 04/24/2019 02:53 AM   Modules accepted: Orders

## 2019-04-24 NOTE — Telephone Encounter (Signed)
Preadmission screen  

## 2019-04-25 ENCOUNTER — Encounter (HOSPITAL_COMMUNITY): Payer: Self-pay | Admitting: *Deleted

## 2019-04-25 ENCOUNTER — Telehealth (HOSPITAL_COMMUNITY): Payer: Self-pay | Admitting: *Deleted

## 2019-04-25 NOTE — Telephone Encounter (Signed)
Preadmission screen  

## 2019-04-26 ENCOUNTER — Encounter: Payer: Self-pay | Admitting: Obstetrics & Gynecology

## 2019-04-26 ENCOUNTER — Ambulatory Visit (INDEPENDENT_AMBULATORY_CARE_PROVIDER_SITE_OTHER): Payer: Medicaid Other | Admitting: Obstetrics & Gynecology

## 2019-04-26 VITALS — BP 136/79 | HR 104

## 2019-04-26 DIAGNOSIS — O133 Gestational [pregnancy-induced] hypertension without significant proteinuria, third trimester: Secondary | ICD-10-CM

## 2019-04-26 DIAGNOSIS — Z3402 Encounter for supervision of normal first pregnancy, second trimester: Secondary | ICD-10-CM

## 2019-04-26 DIAGNOSIS — Z3A36 36 weeks gestation of pregnancy: Secondary | ICD-10-CM

## 2019-04-26 NOTE — Progress Notes (Signed)
   TELEHEALTH VIRTUAL OBSTETRICS VISIT ENCOUNTER NOTE  I connected with Taylor Reed on 04/26/19 at 11:00 AM EDT by telephone at home and verified that I am speaking with the correct person using two identifiers.   I discussed the limitations, risks, security and privacy concerns of performing an evaluation and management service by telephone and the availability of in person appointments. I also discussed with the patient that there may be a patient responsible charge related to this service. The patient expressed understanding and agreed to proceed.  Subjective:  Taylor Reed is a 23 y.o. G1P0 at [redacted]w[redacted]d being followed for ongoing prenatal care.  She is currently monitored for the following issues for this high-risk pregnancy and has Herpes genitalia; Latex allergy; Acne; Encounter for supervision of normal first pregnancy in second trimester; Maternal morbid obesity, antepartum ([place]); and Gestational hypertension on their problem list.  Patient reports no complaints. Reports fetal movement. Denies any contractions, bleeding or leaking of fluid.   The following portions of the patient's history were reviewed and updated as appropriate: allergies, current medications, past family history, past medical history, past social history, past surgical history and problem list.   Objective:   General:  Alert, oriented and cooperative.   Mental Status: Normal mood and affect perceived. Normal judgment and thought content.  Rest of physical exam deferred due to type of encounter  Assessment and Plan:  Pregnancy: G1P0 at [redacted]w[redacted]d 1. Encounter for supervision of normal first pregnancy in second trimester GBS  2. Gestational hypertension, third trimester IOL 37 weeks, FB insertion prior to admission. She is aware for Covid testing and visitor policy at [facility]  Preterm labor symptoms and general obstetric precautions including but not limited to vaginal bleeding, contractions, leaking of fluid and  fetal movement were reviewed in detail with the patient.  I discussed the assessment and treatment plan with the patient. The patient was provided an opportunity to ask questions and all were answered. The patient agreed with the plan and demonstrated an understanding of the instructions. The patient was advised to call back or seek an in-person office evaluation/go to MAU at [facility] for any urgent or concerning symptoms. Please refer to After Visit Summary for other counseling recommendations.   I provided 12 minutes of non-face-to-face time during this encounter.  Return in about 6 days (around 05/02/2019) for Foley insertion.  Future Appointments  Date Time Provider [place]  05/01/2019  7:50 AM [facility]U 1 [facility] None  05/03/2019 12:00 AM [facility] [place] None    Emeterio Reeve, [place] for [facility], [place]

## 2019-04-26 NOTE — Progress Notes (Signed)
I connected with [name] on 04/26/19 at 11:00 AM EDT by telephone and verified that I am speaking with the correct person using two identifiers.  No concerns today per pt.

## 2019-04-26 NOTE — Patient Instructions (Signed)

## 2019-05-01 ENCOUNTER — Other Ambulatory Visit (HOSPITAL_COMMUNITY)
Admission: RE | Admit: 2019-05-01 | Discharge: 2019-05-01 | Disposition: A | Discharge status: Home / Self Care | Payer: Medicaid Other | Source: Ambulatory Visit | Attending: Obstetrics and Gynecology | Admitting: Obstetrics and Gynecology

## 2019-05-01 ENCOUNTER — Other Ambulatory Visit: Payer: Self-pay

## 2019-05-01 DIAGNOSIS — Z1159 Encounter for screening for other viral diseases: Secondary | ICD-10-CM | POA: Diagnosis not present

## 2019-05-01 LAB — SARS CORONAVIRUS 2 (TAT 6-24 HRS): SARS Coronavirus 2: NEGATIVE

## 2019-05-01 NOTE — MAU Note (Signed)
Asymptomatic, swab collected. 

## 2019-05-02 ENCOUNTER — Other Ambulatory Visit: Payer: Self-pay | Admitting: Advanced Practice Midwife

## 2019-05-02 ENCOUNTER — Ambulatory Visit (INDEPENDENT_AMBULATORY_CARE_PROVIDER_SITE_OTHER): Payer: Medicaid Other | Admitting: Certified Nurse Midwife

## 2019-05-02 VITALS — BP 109/72 | HR 94 | Wt 244.6 lb

## 2019-05-02 DIAGNOSIS — O133 Gestational [pregnancy-induced] hypertension without significant proteinuria, third trimester: Secondary | ICD-10-CM

## 2019-05-02 DIAGNOSIS — Z3403 Encounter for supervision of normal first pregnancy, third trimester: Secondary | ICD-10-CM

## 2019-05-02 DIAGNOSIS — Z3A36 36 weeks gestation of pregnancy: Secondary | ICD-10-CM

## 2019-05-02 NOTE — Progress Notes (Signed)
Subjective:  Taylor Reed is a 23 y.o. G1P0 at [redacted]w[redacted]d being seen today for foley bulb placement.  She is currently monitored for the following issues for this high-risk pregnancy and has Herpes genitalia; Latex allergy; Acne; Encounter for supervision of normal first pregnancy in third trimester; Maternal morbid obesity, antepartum ([place]); and Gestational hypertension on their problem list.  Patient reports no complaints.  Contractions: Irritability. Vag. Bleeding: None.  Movement: Present. Denies leaking of fluid.   The following portions of the patient's history were reviewed and updated as appropriate: allergies, current medications, past family history, past medical history, past social history, past surgical history and problem list. Problem list updated.  Objective:   Vitals:   05/02/19 1351  BP: 109/72  Pulse: 94  Weight: 244 lb 9.6 oz (110.9 kg)    Fetal Status: Fetal Heart Rate (bpm): 141   Movement: Present  Presentation: Vertex  General:  Alert, oriented and cooperative. Patient is in no acute distress.  Skin: Skin is warm and dry. No rash noted.   Cardiovascular: Normal heart rate noted  Respiratory: Normal respiratory effort, no problems with respiration noted  Abdomen: Soft, gravid, appropriate for gestational age. Pain/Pressure: Absent     Pelvic: Vag. Bleeding: None     Cervical exam performed Dilation: Closed Effacement (%): 60 Station: -3  Extremities: Normal range of motion.  Edema: Trace  Mental Status: Normal mood and affect. Normal behavior. Normal judgment and thought content.   Urinalysis:       Assessment and Plan:   1. Encounter for supervision of normal first pregnancy in third trimester   2. Gestational hypertension, third trimester     Assessment and Plan:  Pregnancy: G1P0 at [redacted]w[redacted]d  1. Encounter for supervision of normal first pregnancy in third trimester  2. Gestational hypertension, third trimester - BP normal today - asymptomatic -  Unable to place foley d/t closed cervix - IOL tomorrow  Term labor symptoms and general obstetric precautions including but not limited to vaginal bleeding, contractions, leaking of fluid and fetal movement were reviewed in detail with the patient. Please refer to After Visit Summary for other counseling recommendations.  No follow-ups on file.   Julianne Handler, CNM

## 2019-05-02 NOTE — Progress Notes (Signed)
Pt is here for ROB/ NST/ Foley bulb, induction scheduled for tomorrow.

## 2019-05-03 ENCOUNTER — Inpatient Hospital Stay (HOSPITAL_COMMUNITY): Payer: Medicaid Other | Admitting: Anesthesiology

## 2019-05-03 ENCOUNTER — Other Ambulatory Visit: Payer: Self-pay

## 2019-05-03 ENCOUNTER — Inpatient Hospital Stay (HOSPITAL_COMMUNITY)
Admission: AD | Admit: 2019-05-03 | Discharge: 2019-05-06 | DRG: 806 | Disposition: A | Discharge status: Home / Self Care | Payer: Medicaid Other | Attending: Family Medicine | Admitting: Family Medicine

## 2019-05-03 ENCOUNTER — Encounter (HOSPITAL_COMMUNITY): Payer: Self-pay | Admitting: Obstetrics and Gynecology

## 2019-05-03 ENCOUNTER — Inpatient Hospital Stay (HOSPITAL_COMMUNITY): Payer: Medicaid Other

## 2019-05-03 DIAGNOSIS — Z87891 Personal history of nicotine dependence: Secondary | ICD-10-CM

## 2019-05-03 DIAGNOSIS — Z3A37 37 weeks gestation of pregnancy: Secondary | ICD-10-CM | POA: Diagnosis not present

## 2019-05-03 DIAGNOSIS — O164 Unspecified maternal hypertension, complicating childbirth: Secondary | ICD-10-CM | POA: Diagnosis not present

## 2019-05-03 DIAGNOSIS — O99214 Obesity complicating childbirth: Secondary | ICD-10-CM | POA: Diagnosis present

## 2019-05-03 DIAGNOSIS — A6000 Herpesviral infection of urogenital system, unspecified: Secondary | ICD-10-CM | POA: Diagnosis present

## 2019-05-03 DIAGNOSIS — O133 Gestational [pregnancy-induced] hypertension without significant proteinuria, third trimester: Secondary | ICD-10-CM | POA: Diagnosis present

## 2019-05-03 DIAGNOSIS — O9832 Other infections with a predominantly sexual mode of transmission complicating childbirth: Secondary | ICD-10-CM | POA: Diagnosis present

## 2019-05-03 DIAGNOSIS — O134 Gestational [pregnancy-induced] hypertension without significant proteinuria, complicating childbirth: Principal | ICD-10-CM | POA: Diagnosis present

## 2019-05-03 DIAGNOSIS — O99824 Streptococcus B carrier state complicating childbirth: Secondary | ICD-10-CM | POA: Diagnosis present

## 2019-05-03 HISTORY — DX: Gestational (pregnancy-induced) hypertension without significant proteinuria, third trimester: O13.3

## 2019-05-03 LAB — CBC
HCT: 33 % — ABNORMAL LOW (ref 36.0–46.0)
HCT: 33.7 % — ABNORMAL LOW (ref 36.0–46.0)
Hemoglobin: 10.2 g/dL — ABNORMAL LOW (ref 12.0–15.0)
Hemoglobin: 10.5 g/dL — ABNORMAL LOW (ref 12.0–15.0)
MCH: 28.1 pg (ref 26.0–34.0)
MCH: 27.4 pg (ref 26.0–34.0)
MCHC: 30.9 g/dL (ref 30.0–36.0)
MCHC: 31.2 g/dL (ref 30.0–36.0)
MCV: 90.9 fL (ref 80.0–100.0)
MCV: 88 fL (ref 80.0–100.0)
Platelets: 295 10*3/uL (ref 150–400)
Platelets: 278 10*3/uL (ref 150–400)
RBC: 3.63 MIL/uL — ABNORMAL LOW (ref 3.87–5.11)
RBC: 3.83 MIL/uL — ABNORMAL LOW (ref 3.87–5.11)
RDW: 16.2 % — ABNORMAL HIGH (ref 11.5–15.5)
RDW: 16.4 % — ABNORMAL HIGH (ref 11.5–15.5)
WBC: 11.3 10*3/uL — ABNORMAL HIGH (ref 4.0–10.5)
WBC: 13.2 10*3/uL — ABNORMAL HIGH (ref 4.0–10.5)
nRBC: 0 % (ref 0.0–0.2)
nRBC: 0 % (ref 0.0–0.2)

## 2019-05-03 LAB — COMPREHENSIVE METABOLIC PANEL
ALT: 29 U/L (ref 0–44)
AST: 34 U/L (ref 15–41)
Albumin: 2.6 g/dL — ABNORMAL LOW (ref 3.5–5.0)
Alkaline Phosphatase: 81 U/L (ref 38–126)
Anion gap: 9 (ref 5–15)
BUN: 13 mg/dL (ref 6–20)
CO2: 18 mmol/L — ABNORMAL LOW (ref 22–32)
Calcium: 9.8 mg/dL (ref 8.9–10.3)
Chloride: 108 mmol/L (ref 98–111)
Creatinine, Ser: 0.81 mg/dL (ref 0.44–1.00)
GFR calc Af Amer: >60 mL/min (ref >60)
GFR calc non Af Amer: >60 mL/min (ref >60)
Glucose, Bld: 88 mg/dL (ref 70–99)
Potassium: 4.4 mmol/L (ref 3.5–5.1)
Sodium: 135 mmol/L (ref 135–145)
Total Bilirubin: 0.4 mg/dL (ref 0.3–1.2)
Total Protein: 6 g/dL — ABNORMAL LOW (ref 6.5–8.1)

## 2019-05-03 LAB — RPR: RPR Ser Ql: NONREACTIVE

## 2019-05-03 LAB — TYPE AND SCREEN
ABO/RH(D): O POS
Antibody Screen: NEGATIVE

## 2019-05-03 MED ORDER — LABETALOL HCL 5 MG/ML IV SOLN: 20.0000 mg | INTRAVENOUS | Status: DC | PRN

## 2019-05-03 MED ORDER — OXYTOCIN BOLUS FROM INFUSION
500.0000 mL | Freq: Once | INTRAVENOUS | Status: AC
  Administered 2019-05-04: 500 mL via INTRAVENOUS

## 2019-05-03 MED ORDER — LIDOCAINE HCL (PF) 1 % IJ SOLN
INTRAMUSCULAR | Status: DC | PRN
  Administered 2019-05-03 (×2): 5 mL via EPIDURAL

## 2019-05-03 MED ORDER — SODIUM CHLORIDE (PF) 0.9 % IJ SOLN
INTRAMUSCULAR | Status: DC | PRN
  Administered 2019-05-03: 12 mL/h via EPIDURAL

## 2019-05-03 MED ORDER — LACTATED RINGERS IV SOLN
INTRAVENOUS | Status: DC
  Administered 2019-05-03 – 2019-05-04 (×4): via INTRAVENOUS

## 2019-05-03 MED ORDER — OXYCODONE-ACETAMINOPHEN 5-325 MG PO TABS: 1.0000 | ORAL_TABLET | ORAL | Status: DC | PRN

## 2019-05-03 MED ORDER — OXYCODONE-ACETAMINOPHEN 5-325 MG PO TABS: 2.0000 | ORAL_TABLET | ORAL | Status: DC | PRN

## 2019-05-03 MED ORDER — MISOPROSTOL 25 MCG QUARTER TABLET
25.0000 ug | ORAL_TABLET | ORAL | Status: DC | PRN
  Administered 2019-05-03: 01:00:00 25 ug via VAGINAL

## 2019-05-03 MED ORDER — PHENYLEPHRINE 40 MCG/ML (10ML) SYRINGE FOR IV PUSH (FOR BLOOD PRESSURE SUPPORT): 80.0000 ug | PREFILLED_SYRINGE | INTRAVENOUS | Status: DC | PRN

## 2019-05-03 MED ORDER — TERBUTALINE SULFATE 1 MG/ML IJ SOLN: 0.2500 mg | Freq: Once | INTRAMUSCULAR | Status: DC | PRN

## 2019-05-03 MED ORDER — OXYTOCIN 40 UNITS IN NORMAL SALINE INFUSION - SIMPLE MED
1.0000 m[IU]/min | INTRAVENOUS | Status: DC
  Administered 2019-05-03: 2 m[IU]/min via INTRAVENOUS
  Administered 2019-05-04: 09:00:00 16 m[IU]/min via INTRAVENOUS
  Filled 2019-05-03: qty 1000

## 2019-05-03 MED ORDER — LACTATED RINGERS IV SOLN
500.0000 mL | INTRAVENOUS | Status: DC | PRN
  Administered 2019-05-04: 500 mL via INTRAVENOUS

## 2019-05-03 MED ORDER — ACETAMINOPHEN 325 MG PO TABS: 650.0000 mg | ORAL_TABLET | ORAL | Status: DC | PRN

## 2019-05-03 MED ORDER — SODIUM CHLORIDE 0.9 % IV SOLN
5.0000 10*6.[IU] | Freq: Once | INTRAVENOUS | Status: AC
  Administered 2019-05-03: 02:00:00 5 10*6.[IU] via INTRAVENOUS
  Filled 2019-05-03: qty 5

## 2019-05-03 MED ORDER — EPHEDRINE 5 MG/ML INJ: 10.0000 mg | INTRAVENOUS | Status: DC | PRN

## 2019-05-03 MED ORDER — HYDRALAZINE HCL 20 MG/ML IJ SOLN: 10.0000 mg | INTRAMUSCULAR | Status: DC | PRN

## 2019-05-03 MED ORDER — PENICILLIN G 3 MILLION UNITS IVPB - SIMPLE MED
3.0000 10*6.[IU] | INTRAVENOUS | Status: DC
  Administered 2019-05-03 – 2019-05-04 (×7): 3 10*6.[IU] via INTRAVENOUS
  Filled 2019-05-03 (×7): qty 100

## 2019-05-03 MED ORDER — FENTANYL-BUPIVACAINE-NACL 0.5-0.125-0.9 MG/250ML-% EP SOLN
12.0000 mL/h | EPIDURAL | Status: DC | PRN
  Filled 2019-05-03: qty 250

## 2019-05-03 MED ORDER — FENTANYL CITRATE (PF) 100 MCG/2ML IJ SOLN
100.0000 ug | INTRAMUSCULAR | Status: DC | PRN
  Administered 2019-05-03 (×4): 100 ug via INTRAVENOUS
  Filled 2019-05-03 (×4): qty 2

## 2019-05-03 MED ORDER — SOD CITRATE-CITRIC ACID 500-334 MG/5ML PO SOLN: 30.0000 mL | ORAL | Status: DC | PRN

## 2019-05-03 MED ORDER — MISOPROSTOL 25 MCG QUARTER TABLET
ORAL_TABLET | ORAL | Status: AC
  Filled 2019-05-03: qty 1

## 2019-05-03 MED ORDER — LABETALOL HCL 5 MG/ML IV SOLN: 40.0000 mg | INTRAVENOUS | Status: DC | PRN

## 2019-05-03 MED ORDER — LACTATED RINGERS IV SOLN
500.0000 mL | Freq: Once | INTRAVENOUS | Status: AC
  Administered 2019-05-03: 500 mL via INTRAVENOUS

## 2019-05-03 MED ORDER — LIDOCAINE HCL (PF) 1 % IJ SOLN: 30.0000 mL | INTRAMUSCULAR | Status: DC | PRN

## 2019-05-03 MED ORDER — PHENYLEPHRINE 40 MCG/ML (10ML) SYRINGE FOR IV PUSH (FOR BLOOD PRESSURE SUPPORT)
80.0000 ug | PREFILLED_SYRINGE | INTRAVENOUS | Status: DC | PRN
  Filled 2019-05-03: qty 10

## 2019-05-03 MED ORDER — ONDANSETRON HCL 4 MG/2ML IJ SOLN: 4.0000 mg | Freq: Four times a day (QID) | INTRAMUSCULAR | Status: DC | PRN

## 2019-05-03 MED ORDER — DIPHENHYDRAMINE HCL 50 MG/ML IJ SOLN: 12.5000 mg | INTRAMUSCULAR | Status: DC | PRN

## 2019-05-03 MED ORDER — OXYTOCIN 40 UNITS IN NORMAL SALINE INFUSION - SIMPLE MED
2.5000 [IU]/h | INTRAVENOUS | Status: DC
  Filled 2019-05-03: qty 1000

## 2019-05-03 MED ORDER — LABETALOL HCL 5 MG/ML IV SOLN: 80.0000 mg | INTRAVENOUS | Status: DC | PRN

## 2019-05-03 NOTE — Progress Notes (Signed)
LABOR PROGRESS NOTE  Taylor Reed is a 23 y.o. G1P0 at [redacted]w[redacted]d  admitted for IOL due to [place].  Subjective: Doing well with no concerns at this time. Pain is controlled. Blood pressures have been stable.   Objective: BP (!) 102/53   Pulse 89   Temp 97.6 F (36.4 C) (Oral)   Resp 16   Ht 5\' 4"  (1.626 m)   Wt 111.5 kg   LMP 08/17/2018   BMI 42.19 kg/m  or  Vitals:   05/03/19 1031 05/03/19 1101 05/03/19 1133 05/03/19 1201  BP: (!) 101/56 (!) 101/48  (!) 102/53  Pulse: (!) 110 87  89  Resp:      Temp:   97.6 F (36.4 C)   TempSrc:   Oral   Weight:      Height:       Dilation: 3 Effacement (%): 60 Station: -3 Presentation: Vertex Exam by:: Merri Brunette, RN FHT: Baseline rate 130, moderate varibility, +accel, -decel Toco: Contractions every 2-4 minutes  Labs: Lab Results  Component Value Date   WBC 11.3 (H) 05/03/2019   HGB 10.2 (L) 05/03/2019   HCT 33.0 (L) 05/03/2019   MCV 90.9 05/03/2019   PLT 295 05/03/2019    Patient Active Problem List   Diagnosis Date Noted  . Gestational hypertension, third trimester 05/03/2019  . Gestational hypertension 04/05/2019  . Encounter for supervision of normal first pregnancy in third trimester 12/01/2018  . Maternal morbid obesity, antepartum ([place]) 12/01/2018  . 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 Acne 07/02/2015  . Herpes genitalia 11/08/2014    Assessment / Plan: 23 y.o. G1P0 at [redacted]w[redacted]d here for IOL due to gHTN.  Labor: Unchanged from last check. Will continue to increase Pitocin per protocol; last at 12. Recheck in ~4 hours.  Fetal Wellbeing:  Category 1 tracing as above  Pain Control:  IV Fentanyl PRN; planning for epidural  Anticipated MOD:  SVD  Vilma Meckel, MD  Family Medicine, PGY-2 05/03/2019, 12:57 PM

## 2019-05-03 NOTE — Progress Notes (Addendum)
LABOR PROGRESS NOTE  Taylor Reed is a 23 y.o. G1P0 at [redacted]w[redacted]d  admitted for IOL due to [place].   Subjective: She is doing well resting in bed and having minimal discomfort with contractions.  Objective: BP 109/64   Pulse 81   Temp 98 F (36.7 C) (Oral)   Resp 16   Ht 5\' 4"  (1.626 m)   Wt 111.5 kg   LMP 08/17/2018   BMI 42.19 kg/m  or  Vitals:   05/03/19 2037 05/03/19 2107 05/03/19 2132 05/03/19 2201  BP: 110/74 95/81 (!) 99/49 109/64  Pulse: 98 90 88 81  Resp:      Temp:      TempSrc:      Weight:      Height:        Dilation: 3 Effacement (%): 70 Station: -3 Presentation: Vertex Exam by:: CNM Marie FHT: baseline rate 130, moderate varibility, 15 x15 acel, no decel Toco: 1 mins  Labs: Lab Results  Component Value Date   WBC 11.3 (H) 05/03/2019   HGB 10.2 (L) 05/03/2019   HCT 33.0 (L) 05/03/2019   MCV 90.9 05/03/2019   PLT 295 05/03/2019    Patient Active Problem List   Diagnosis Date Noted  . Gestational hypertension, third trimester 05/03/2019  . Gestational hypertension 04/05/2019  . Encounter for supervision of normal first pregnancy in third trimester 12/01/2018  . Maternal morbid obesity, antepartum ([place]) 12/01/2018  . 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 Acne 07/02/2015  . Herpes genitalia 11/08/2014    Assessment / Plan: 23 y.o. G1P0 at [redacted]w[redacted]d here for  IOL due to gHTN.    Labor: AROM and IUPC @ 2134. Pit 24 mU/min. Continue cervical exams for progression of labor. Fetal Wellbeing:  Cat I Pain Control:  Maternally supported. Planning for epidrual Anticipated MOD:  Vaginal  Elva Breaker [place]-Lott, D.O. Family Medicine Resident, PGY-1 05/03/2019, 10:35 PM

## 2019-05-03 NOTE — Anesthesia Procedure Notes (Signed)
Epidural Patient location during procedure: OB  Staffing Anesthesiologist: Ariannah Arenson, MD Performed: anesthesiologist   Preanesthetic Checklist Completed: patient identified, site marked, surgical consent, pre-op evaluation, timeout performed, IV checked, risks and benefits discussed and monitors and equipment checked  Epidural Patient position: sitting Prep: DuraPrep Patient monitoring: heart rate, continuous pulse ox and blood pressure Approach: midline Location: L3-L4 Injection technique: LOR saline  Needle:  Needle type: Tuohy  Needle gauge: 17 G Needle length: 9 cm and 9 Needle insertion depth: 7 cm Catheter type: closed end flexible Catheter size: 20 Guage Catheter at skin depth: 12 cm Test dose: negative  Assessment Events: blood not aspirated, injection not painful, no injection resistance, negative IV test and no paresthesia  Additional Notes Patient identified. Risks/Benefits/Options discussed with patient including but not limited to bleeding, infection, nerve damage, paralysis, failed block, incomplete pain control, headache, blood pressure changes, nausea, vomiting, reactions to medication both or allergic, itching and postpartum back pain. Confirmed with bedside nurse the patient's most recent platelet count. Confirmed with patient that they are not currently taking any anticoagulation, have any bleeding history or any family history of bleeding disorders. Patient expressed understanding and wished to proceed. All questions were answered. Sterile technique was used throughout the entire procedure. Please see nursing notes for vital signs. Test dose was given through epidural needle and negative prior to continuing to dose epidural or start infusion. Warning signs of high block given to the patient including shortness of breath, tingling/numbness in hands, complete motor block, or any concerning symptoms with instructions to call for help. Patient was given  instructions on fall risk and not to get out of bed. All questions and concerns addressed with instructions to call with any issues.     

## 2019-05-03 NOTE — Progress Notes (Signed)
Patient ID: Taylor Reed, female   DOB: November 12, 1995, 23 y.o.   MRN: [id number] Taylor Reed is a 23 y.o. G1P0 at [redacted]w[redacted]d admitted for induction of labor due to [facility].  Subjective: Doing well, foley bulb out around 0700  Objective: BP 120/71   Pulse 94   Temp 98.5 F (36.9 C) (Oral)   Resp 16   Ht 5\' 4"  (1.626 m)   Wt 111.5 kg   LMP 08/17/2018   BMI 42.19 kg/m  No intake/output data recorded.  FHT:  FHR: 130 bpm, variability: moderate,  accelerations:  Present,  decelerations:  Absent UC:   irregular  SVE:   Dilation: 1 Effacement (%): 50 Exam by:: K Arael Piccione CNM   Labs: Lab Results  Component Value Date   WBC 11.3 (H) 05/03/2019   HGB 10.2 (L) 05/03/2019   HCT 33.0 (L) 05/03/2019   MCV 90.9 05/03/2019   PLT 295 05/03/2019    Assessment / Plan: IOL d/t GHTN, s/p foley bulb & cytotec x 1, RN to check cervix and start pitocin per protocol  Labor: s/p cervical ripening Fetal Wellbeing:  Category I Pain Control:  n/a Pre-eclampsia: P:C still pending, bp's stable, asymptomatic I/D:  PCN for GBS+ Anticipated MOD:  NSVD  Roma Schanz CNM, WHNP-BC 05/03/2019, 7:54 AM

## 2019-05-03 NOTE — Anesthesia Preprocedure Evaluation (Signed)
Anesthesia Evaluation  Patient identified by MRN, date of birth, ID band Patient awake    Reviewed: Allergy & Precautions, H&P , NPO status , Patient's Chart, lab work & pertinent test results  History of Anesthesia Complications Negative for: history of anesthetic complications  Airway Mallampati: II  TM Distance: >3 FB Neck ROM: full    Dental no notable dental hx. (+) Teeth Intact   Pulmonary neg pulmonary ROS, former smoker,    Pulmonary exam normal breath sounds clear to auscultation       Cardiovascular hypertension, Normal cardiovascular exam Rhythm:regular Rate:Normal     Neuro/Psych negative neurological ROS  negative psych ROS   GI/Hepatic negative GI ROS, Neg liver ROS,   Endo/Other  Morbid obesity  Renal/GU negative Renal ROS  negative genitourinary   Musculoskeletal   Abdominal   Peds  Hematology negative hematology ROS (+)   Anesthesia Other Findings   Reproductive/Obstetrics (+) Pregnancy                             Anesthesia Physical Anesthesia Plan  ASA: III  Anesthesia Plan: Epidural   Post-op Pain Management:    Induction:   PONV Risk Score and Plan:   Airway Management Planned:   Additional Equipment:   Intra-op Plan:   Post-operative Plan:   Informed Consent: I have reviewed the patients History and Physical, chart, labs and discussed the procedure including the risks, benefits and alternatives for the proposed anesthesia with the patient or authorized representative who has indicated his/her understanding and acceptance.       Plan Discussed with:   Anesthesia Plan Comments:         Anesthesia Quick Evaluation

## 2019-05-03 NOTE — Progress Notes (Signed)
LABOR PROGRESS NOTE  Taylor Reed is a 23 y.o. G1P0 at [redacted]w[redacted]d admitted for IOL due to [place].  Subjective: Doing well, not feeling her contractions very much. Pressures well-controlled.   Objective: BP (!) 142/101   Pulse (!) 120   Temp 97.6 F (36.4 C) (Oral)   Resp 16   Ht 5\' 4"  (1.626 m)   Wt 111.5 kg   LMP 08/17/2018   BMI 42.19 kg/m  or  Vitals:   05/03/19 1501 05/03/19 1531 05/03/19 1601 05/03/19 1631  BP: (!) 101/56 (!) 114/51 128/78 (!) 142/101  Pulse: 93 88 83 (!) 120  Resp:      Temp:      TempSrc:      Weight:      Height:       Dilation: 3 Effacement (%): 60 Station: -3 Presentation: Vertex Exam by:: Merri Brunette, RN FHT: Baseline rate 135, moderate varibility, +accel, -decel Toco: None; adjusting monitor  Labs: Lab Results  Component Value Date   WBC 11.3 (H) 05/03/2019   HGB 10.2 (L) 05/03/2019   HCT 33.0 (L) 05/03/2019   MCV 90.9 05/03/2019   PLT 295 05/03/2019    Patient Active Problem List   Diagnosis Date Noted  . Gestational hypertension, third trimester 05/03/2019  . Gestational hypertension 04/05/2019  . Encounter for supervision of normal first pregnancy in third trimester 12/01/2018  . Maternal morbid obesity, antepartum ([place]) 12/01/2018  . 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 Acne 07/02/2015  . Herpes genitalia 11/08/2014    Assessment / Plan: 23 y.o. G1P0 at [redacted]w[redacted]d here for IOL due to gHTN.  Labor: Minimal change at last check and continues to have minimal feeling of her contractions. Will continue to increase Pitocin and recheck once contractions are stronger/closer together. Fetal Wellbeing: Category 1 tracing as above  Pain Control: IV Fentanyl PRN; planning for epidural when necessary Anticipated MOD: SVD  Vilma Meckel, MD  Family Medicine, PGY-2 05/03/2019, 5:44 PM

## 2019-05-03 NOTE — H&P (Addendum)
Taylor Reed is a 23 y.o. G1P0 female at 39w0dby LMP c/w 19wk u/s, presenting for IOL d/t GHTN dx @ 33wks.  BPs have since normalized. Reports active fetal movement, contractions: none, vaginal bleeding: none, membranes: intact. [facility]   Most recent u/s: none since 28wks.   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 Depression   . DUB (dysfunctional uterine bleeding)   . GC (gonococcus infection) 07/09/2014  . Seasonal allergies   . UTI (urinary tract infection) 07/09/2014    Past Surgical History: Past Surgical History:  Procedure Laterality Date  . NO PAST SURGERIES      Obstetrical History: OB History    Gravida  1   Para      Term      Preterm      AB      Living        SAB      TAB      Ectopic      Multiple      Live Births              Social History: Social History   Socioeconomic History  . Marital status: Single    Spouse name: Not on file  . Number of children: Not on file  . Years of education: Not on file  . Highest education level: Not on file  Occupational History  . Not on file  Social Needs  . Financial resource strain: Not on file  . Food insecurity    Worry: Never true    Inability: Not on file  . Transportation needs    Medical: No    Non-medical: Not on file  Tobacco Use  . Smoking status: Former Smoker    Types: Cigarettes  . Smokeless tobacco: Never Used  . Tobacco comment: none since dec 2019  Substance and Sexual Activity  . Alcohol use: Not Currently    Comment: none since + UPT  . Drug use: Not Currently  . Sexual activity: Never  Lifestyle  . Physical activity    Days per week: Not on file    Minutes per session: Not on file  . Stress: Not on file  Relationships  . Social [place] phone: Not on file   Gets together: Not on file    Attends religious service: Not on file    Active member of club or organization: Not on file    Attends meetings of clubs or organizations: Not on file    Relationship status: Not on file  Other Topics Concern  . Not on file  Social History Narrative  . Not on file    Family History: Family History  Problem Relation Age of Onset  . Cancer Mother     Allergies: Allergies  Allergen Reactions  . Latex Rash    Medications Prior to Admission  Medication Sig Dispense Refill Last Dose  . famotidine (PEPCID) 20 MG tablet Take 1 tablet (20 mg total) by mouth 2 (two) times daily. 60 tablet 3 05/02/2019 at Unknown time  . aspirin EC 81 MG tablet Take 1 tablet (81 mg total) by mouth daily. Take after 12 weeks for prevention of preeclampsia later in pregnancy 300 tablet 2   . Blood Pressure Monitor KIT 1 each by Does not apply route daily. 1  each 0   . Doxylamine-Pyridoxine (DICLEGIS) 10-10 MG TBEC Take 2 tablets by mouth at bedtime. If symptoms persist, add one tablet in the morning and one in the afternoon (Patient not taking: Reported on 04/26/2019) 100 tablet 5   . Elastic Bandages & Supports (COMFORT FIT MATERNITY SUPP MED) MISC 1 Device by Does not apply route daily. (Patient not taking: Reported on 04/19/2019) 1 each 0   . fluconazole (DIFLUCAN) 150 MG tablet Take 1 tablet (150 mg total) by mouth daily. (Patient not taking: Reported on 04/26/2019) 1 tablet 0   . metoCLOPramide (REGLAN) 10 MG tablet Take 1 tablet (10 mg total) by mouth every 6 (six) hours as needed for nausea. (Patient not taking: Reported on 04/19/2019) 60 tablet 5   . penicillin v potassium (VEETID) 500 MG tablet Take 500 mg by mouth every 6 (six) hours.     . Prenat-Fe Poly-Methfol-FA-DHA (VITAFOL ULTRA) 29-0.6-0.4-200 MG CAPS Take 1 capsule by mouth daily before breakfast. 90 capsule 4   . Prenatal MV-Min-FA-Omega-3 (PRENATAL GUMMIES/DHA & FA) 0.4-32.5 MG CHEW Chew by mouth.     . valACYclovir  (VALTREX) 500 MG tablet Take 1 tablet (500 mg total) by mouth 2 (two) times daily. 60 tablet 0     Review of Systems  Pertinent pos/neg as indicated in HPI  Last menstrual period 08/17/2018. General appearance: alert, cooperative and no distress Lungs: clear to auscultation bilaterally Heart: regular rate and rhythm Abdomen: gravid, soft, non-tender Extremities: tr edema DTR's 2+  Fetal monitoring: FHR: 130 bpm, variability: moderate,  Accelerations: Present,  decelerations:  Absent Uterine activity: irregular   Presentation: cephalic  SVE: 4/58/-5, vtx Cervical foley bulb inserted and inflated w/ 77m LR w/o difficulty  Cytotec 269m placed in posterior fornix   Prenatal labs: ABO, Rh: O/Positive/-- (02/13 1530) Antibody: Negative (02/13 1530) Rubella: 1.87 (02/13 1530) RPR: Non Reactive (05/21 0933)  HBsAg: Negative (02/13 1530)  HIV: Non Reactive (05/21 0933)  GBS:   Pos  2hr GTT: 75/80/92 Genetic screening:  normal Anatomy US[place]ormal  No results found for this or any previous visit (from the past 24 hour(s)).   Assessment:  3721w0dUP  G1P0  GHTN w/ normalized bp's  Cat 1 FHR  GBS  pos  Plan:  Admit to BS  IV pain meds/epidural prn active labor  Cytotec, foley bulb in   Anticipate NSVB  Pre-e labs  Discussed poc w/ Dr. AnyHarolyn Rutherfordo proceed w/ IOL  PCN for +GBS   Plans to breastfeed  Contraception: undecided  Circumcision: outpatient  KimRoma SchanzM, WHNP-BC 05/03/2019, 12:53 AM

## 2019-05-04 ENCOUNTER — Other Ambulatory Visit: Payer: Self-pay

## 2019-05-04 ENCOUNTER — Encounter (HOSPITAL_COMMUNITY): Payer: Self-pay | Admitting: General Practice

## 2019-05-04 DIAGNOSIS — O99824 Streptococcus B carrier state complicating childbirth: Secondary | ICD-10-CM

## 2019-05-04 DIAGNOSIS — O134 Gestational [pregnancy-induced] hypertension without significant proteinuria, complicating childbirth: Secondary | ICD-10-CM

## 2019-05-04 DIAGNOSIS — Z3A37 37 weeks gestation of pregnancy: Secondary | ICD-10-CM

## 2019-05-04 LAB — CBC
HCT: 32.4 % — ABNORMAL LOW (ref 36.0–46.0)
Hemoglobin: 10.1 g/dL — ABNORMAL LOW (ref 12.0–15.0)
MCH: 28 pg (ref 26.0–34.0)
MCHC: 31.2 g/dL (ref 30.0–36.0)
MCV: 89.8 fL (ref 80.0–100.0)
Platelets: 265 10*3/uL (ref 150–400)
RBC: 3.61 MIL/uL — ABNORMAL LOW (ref 3.87–5.11)
RDW: 16.5 % — ABNORMAL HIGH (ref 11.5–15.5)
WBC: 16 10*3/uL — ABNORMAL HIGH (ref 4.0–10.5)
nRBC: 0 % (ref 0.0–0.2)

## 2019-05-04 LAB — ABO/RH: ABO/RH(D): O POS

## 2019-05-04 MED ORDER — TETANUS-DIPHTH-ACELL PERTUSSIS 5-2.5-18.5 LF-MCG/0.5 IM SUSP
0.5000 mL | Freq: Once | INTRAMUSCULAR | Status: AC
  Administered 2019-05-06: 0.5 mL via INTRAMUSCULAR
  Filled 2019-05-04: qty 0.5

## 2019-05-04 MED ORDER — PRENATAL MULTIVITAMIN CH
1.0000 | ORAL_TABLET | Freq: Every day | ORAL | Status: DC
  Administered 2019-05-05 – 2019-05-06 (×2): 1 via ORAL
  Filled 2019-05-04 (×2): qty 1

## 2019-05-04 MED ORDER — ACETAMINOPHEN 325 MG PO TABS: 650.0000 mg | ORAL_TABLET | ORAL | Status: DC | PRN

## 2019-05-04 MED ORDER — ONDANSETRON HCL 4 MG/2ML IJ SOLN: 4.0000 mg | INTRAMUSCULAR | Status: DC | PRN

## 2019-05-04 MED ORDER — DIPHENHYDRAMINE HCL 25 MG PO CAPS: 25.0000 mg | ORAL_CAPSULE | Freq: Four times a day (QID) | ORAL | Status: DC | PRN

## 2019-05-04 MED ORDER — IBUPROFEN 600 MG PO TABS
600.0000 mg | ORAL_TABLET | Freq: Four times a day (QID) | ORAL | Status: DC
  Administered 2019-05-04 – 2019-05-06 (×7): 600 mg via ORAL
  Filled 2019-05-04 (×8): qty 1

## 2019-05-04 MED ORDER — COCONUT OIL OIL
1.0000 "application " | TOPICAL_OIL | Status: DC | PRN
  Administered 2019-05-04: 1 via TOPICAL

## 2019-05-04 MED ORDER — WITCH HAZEL-GLYCERIN EX PADS: 1.0000 "application " | MEDICATED_PAD | CUTANEOUS | Status: DC | PRN

## 2019-05-04 MED ORDER — FENTANYL-BUPIVACAINE-NACL 0.5-0.125-0.9 MG/250ML-% EP SOLN: 12.0000 mL/h | EPIDURAL | Status: DC | PRN

## 2019-05-04 MED ORDER — ONDANSETRON HCL 4 MG PO TABS: 4.0000 mg | ORAL_TABLET | ORAL | Status: DC | PRN

## 2019-05-04 MED ORDER — BENZOCAINE-MENTHOL 20-0.5 % EX AERO: 1.0000 "application " | INHALATION_SPRAY | CUTANEOUS | Status: DC | PRN

## 2019-05-04 MED ORDER — DIBUCAINE (PERIANAL) 1 % EX OINT: 1.0000 "application " | TOPICAL_OINTMENT | CUTANEOUS | Status: DC | PRN

## 2019-05-04 MED ORDER — SENNOSIDES-DOCUSATE SODIUM 8.6-50 MG PO TABS
2.0000 | ORAL_TABLET | ORAL | Status: DC
  Administered 2019-05-04 – 2019-05-05 (×2): 2 via ORAL
  Filled 2019-05-04 (×2): qty 2

## 2019-05-04 MED ORDER — SIMETHICONE 80 MG PO CHEW: 80.0000 mg | CHEWABLE_TABLET | ORAL | Status: DC | PRN

## 2019-05-04 MED ORDER — MEASLES, MUMPS & RUBELLA VAC IJ SOLR: 0.5000 mL | Freq: Once | INTRAMUSCULAR | Status: DC

## 2019-05-04 NOTE — Progress Notes (Signed)
Patient ID: Taylor Reed, female   DOB: 1996/09/11, 23 y.o.   MRN: [id number] Now progressing well We have been managing her closely all night but because of pressing patient care issues, have not been able to write notes  Vitals:   05/04/19 0338 05/04/19 0339 05/04/19 0401 05/04/19 0403  BP: 96/63 96/63 (!) 97/49 (!) 93/53  Pulse: 91 91 87 96  Resp:      Temp:  98.8 F (37.1 C)    TempSrc:  Oral    Weight:      Height:       FHR reactive now Has had issues with FHR decels which required turning Pitocin off  Now back on and progressing Dilation: 7.5 Effacement (%): 90 Station: -1, 0 Presentation: Vertex Exam by:: Barnicle RN  Will anticipate SVD soon

## 2019-05-04 NOTE — Progress Notes (Signed)
Labor Progress Note Taylor Reed is a 23 y.o. G1P0 at [redacted]w[redacted]d presented for IOL for gHTN  S:  Comfortable with epidural, not feeling pressure.  O:  BP 107/65   Pulse 100   Temp 98.3 F (36.8 C) (Oral)   Resp 18   Ht 5\' 4"  (1.626 m)   Wt 111.5 kg   LMP 08/17/2018   BMI 42.19 kg/m  EFM: baseline 135 bpm/ mod variability/ + accels/ variable decels  Toco: 1.5-4 SVE: Dilation: 10 Dilation Complete Date: 05/04/19 Dilation Complete Time: 0840 Effacement (%): 100 Station: Plus 2 Presentation: Vertex Exam by:: Krystal Eaton RN  Pitocin: 16 mu/min  A/P: 23 y.o. G1P0 [redacted]w[redacted]d  1. Labor: second stage 2. FWB: Cat II 3. Pain: epidural 4. gHTN- stable  Labored down for 1 hr, will start pushing. Anticipate SVD.  Julianne Handler, CNM 10:36 AM

## 2019-05-04 NOTE — Discharge Summary (Signed)
Postpartum Discharge Summary     Patient Name: Taylor Reed DOB: 1996-07-09 MRN: [id number]  Date of admission: 05/03/2019 Delivering Provider: Julianne Handler   Date of discharge: 05/06/2019  Admitting diagnosis: PREG Intrauterine pregnancy: [redacted]w[redacted]d    Secondary diagnosis:  Active Problems:   Gestational hypertension, third trimester   SVD (spontaneous vaginal delivery)  Additional problems: obesity     Discharge diagnosis: Term Pregnancy Delivered and Gestational Hypertension                                                                                                Post partum procedures:None  Augmentation: AROM, Pitocin, Cytotec and Foley Balloon  Complications: None  Hospital course:  Induction of Labor With Vaginal Delivery   23y.o. yo G1P0 at 352w1das admitted to the hospital 05/03/2019 for induction of labor.  Indication for induction: Gestational hypertension.  Patient had an uncomplicated labor course as follows: Membrane Rupture Time/Date: 9:34 PM ,05/03/2019   Intrapartum Procedures: Episiotomy: None [1]                                         Lacerations:  Periurethral [8]  Patient had delivery of a Viable infant.  Information for the patient's newborn:  MuFaylene, [place]0[[id number]]Delivery Method: Vag-Spont    05/04/2019  Details of delivery can be found in separate delivery note.  Patient had a routine postpartum course. Patient is discharged home 05/06/19.  She reports that her bleeding is "not a lot" and her pain is being appropriately managed with ibuprofen.  She is currently bottle feeding and hopes to breastfeed once her "milk comes in."  She denies N/V, issues with urination, and has had a bowel movement.  She reports a scheduled appt on Tuesday July 21st.  Patient requests Depo Provera and Tdap injection prior to discharge.   Magnesium Sulfate recieved: No BMZ received: No  Physical exam  Vitals:   05/05/19 0535 05/05/19 1414 05/05/19  2200 05/06/19 0530  BP: 107/71 110/65 96/61 112/68  Pulse: 78  86 72  Resp: _0 Temp: 98 F (36.7 C) 98.1 F (36.7 C) 98.1 F (36.7 C) 98 F (36.7 C)  TempSrc: Oral Oral  Oral  SpO2: 100% 100% 99% 100%  Weight:      Height:       General: alert, cooperative and no distress  Chest: Lungs CTA, Heart RRR Abdomen: Soft, NonTender, BSx4 Lochia: appropriate Uterine Fundus: firm U/-2 Incision: N/A DVT Evaluation: No evidence of DVT seen on physical exam. No significant calf/ankle edema.  Labs: Lab Results  Component Value Date   WBC 16.0 (H) 05/04/2019   HGB 10.1 (L) 05/04/2019   HCT 32.4 (L) 05/04/2019   MCV 89.8 05/04/2019   PLT 265 05/04/2019   CMP Latest Ref Rng & Units 05/03/2019  Glucose 70 - 99 mg/dL 88  BUN 6 - 20 mg/dL 13  Creatinine 0.44 - 1.00 mg/dL 0.81  Sodium 135 - 145  mmol/L 135  Potassium 3.5 - 5.1 mmol/L 4.4  Chloride 98 - 111 mmol/L 108  CO2 22 - 32 mmol/L 18(L)  Calcium 8.9 - 10.3 mg/dL 9.8  Total Protein 6.5 - 8.1 g/dL 6.0(L)  Total Bilirubin 0.3 - 1.2 mg/dL 0.4  Alkaline Phos 38 - 126 U/L 81  AST 15 - 41 U/L 34  ALT 0 - 44 U/L 29    Discharge instruction: per After Visit Summary and "Baby and Me Booklet". Pain Management, Peri-Care, Breastfeeding, Who and When to call for postpartum complications. Information Sheet(s) given Postpartum Baby Blues and Depression, Depo Provera Injections  After visit meds:  Allergies as of 05/06/2019      Reactions   Latex Rash      Medication List    STOP taking these medications   aspirin EC 81 MG tablet   Blood Pressure Monitor Kit   Comfort Fit Maternity Supp Med Misc   Doxylamine-Pyridoxine 10-10 MG Tbec Commonly known as: Diclegis   famotidine 20 MG tablet Commonly known as: PEPCID   fluconazole 150 MG tablet Commonly known as: Diflucan   Fusion Plus Caps   metoCLOPramide 10 MG tablet Commonly known as: REGLAN     TAKE these medications   ibuprofen 600 MG tablet Commonly known  as: ADVIL Take 1 tablet (600 mg total) by mouth every 6 (six) hours.   medroxyPROGESTERone 150 MG/ML injection Commonly known as: DEPO-PROVERA Inject 1 mL (150 mg total) into the muscle every 3 (three) months.   Tdap 5-2.5-18.5 LF-MCG/0.5 injection Commonly known as: BOOSTRIX Inject 0.5 mLs into the muscle once for 1 dose.   valACYclovir 500 MG tablet Commonly known as: VALTREX Take 1 tablet (500 mg total) by mouth 2 (two) times daily.   Vitafol Ultra 29-0.6-0.4-200 MG Caps Take 1 capsule by mouth daily before breakfast.       Diet: routine diet  Activity: Advance as tolerated. Pelvic rest for 6 weeks.   Outpatient follow up:4 weeks with 1 week BP check Follow up Appt: Future Appointments  Date Time Provider [place]  05/11/2019  8:30 AM [place] None  06/01/2019  9:30 AM Lajean Manes, CNM [facility] None   Follow up Visit:   Please schedule this patient for Postpartum visit in: 4 weeks with the following provider: Any provider For C/S patients schedule nurse incision check in weeks 2 weeks: no Low risk pregnancy complicated by: GHTN Delivery mode:  SVD Anticipated Birth Control:  Depo PP Procedures needed: BP check  Schedule Integrated BH visit: no      Newborn Data: Live born female  Birth Weight:   APGAR: 91, 9  Newborn Delivery   Birth date/time: 05/04/2019 11:05:00 Delivery type: Vaginal, Spontaneous      Baby Feeding: Bottle Disposition:home with mother   05/06/2019 Maryann Conners, CNM

## 2019-05-04 NOTE — Progress Notes (Signed)
[name] was referred for history of depression/anxiety. * Referral screened out by Clinical Social Worker because none of the following criteria appear to apply: ~ History of anxiety/depression during this pregnancy, or of post-partum depression following prior delivery. ~ Diagnosis of anxiety and/or depression within last 3 years. Per [name]'s chart review [name] appears to have been diagnosised in 2015 with depression.  OR * [name]'s symptoms currently being treated with medication and/or therapy.     Please contact the Clinical Social Worker if needs arise, by [facility] request, or if [name] scores greater than 9/yes to question 10 on [place] Postpartum Depression Screen.      Virgie Dad Mackenze Grandison, MSW, LCSW [facility] [place] [facility] [place] [phone]

## 2019-05-04 NOTE — Lactation Note (Signed)
This note was copied from a baby's chart. Lactation Consultation Note  Patient Name: Taylor Reed SWNIO'E Date: 05/04/2019 Reason for consult: Initial assessment;Early term 37-38.6wks;[place] Baby is 37.1 weeks and 3 hours old.  Mom chooses to breast and formula feed.  Recommended mom work on breastfeeding now before introducing formula.  RN has initiated a nipple shield.  Baby starting to cue and mom was hesitant but willing to put baby to breast.  Breasts are large and heavy.  Nipples flat with areolar edema.  Due to tissue firmness breast tissue is difficult to compress.  Hand expression done but no colostrum seen.  Baby unable to grasp tissue so 20 mm nipple shield applied.  Breast supported by a rolled cloth diaper.  Baby latched and fed for 7 minutes.  Mom comfortable.  One small drop of colostrum seen in shield but no swallows noted.  Explained to mom that post pumping is recommended to stimulate milk supply.  Symphony pump set up and initiated.  Mom will breastfeed with cues using nipple shield, post pump and hand express and give any colostrum to baby.  Encouraged to call for assist prn.  Maternal Data Has patient been taught Hand Expression?: Yes Does the patient have breastfeeding experience prior to this delivery?: No  Feeding Feeding Type: Breast Fed  LATCH Score Latch: Repeated attempts needed to sustain latch, nipple held in mouth throughout feeding, stimulation needed to elicit sucking reflex.  Audible Swallowing: None  Type of Nipple: Flat  Comfort (Breast/Nipple): Soft / non-tender  Hold (Positioning): Assistance needed to correctly position infant at breast and maintain latch.  LATCH Score: 5  Interventions Interventions: Breast feeding basics reviewed;Breast compression;Assisted with latch;Adjust position;Skin to skin;Support pillows;Breast massage;Hand express;DEBP  Lactation Tools Discussed/Used Tools: Nipple Shields Nipple shield size: 20 WIC Program:  Yes Pump Review: Setup, frequency, and cleaning;Milk Storage Initiated by:: LMoulden Date initiated:: 05/04/19   Consult Status Consult Status: Follow-up Date: 05/05/19 Follow-up type: In-patient    Ave Filter 05/04/2019, 2:58 PM

## 2019-05-05 MED ORDER — MEDROXYPROGESTERONE ACETATE 150 MG/ML IM SUSP: 150.0000 mg | Freq: Once | INTRAMUSCULAR | Status: DC

## 2019-05-05 NOTE — Progress Notes (Addendum)
POSTPARTUM PROGRESS NOTE  Post Partum Day 1  Subjective:  Taylor Reed is a 23 y.o. G1P1001 s/p SVD at [redacted]w[redacted]d  She reports she is doing well. No acute events overnight. She denies any problems with ambulating, voiding or po intake. Denies nausea or vomiting.  Pain is well controlled.  Lochia is appropriate.  Objective: Blood pressure 107/71, pulse 78, temperature 98 F (36.7 C), temperature source Oral, resp. rate 16, height _0  (1.626 m), weight 111.5 kg, last menstrual period 08/17/2018, SpO2 100 %, unknown if currently breastfeeding.  Physical Exam:  General: alert, cooperative and no distress Chest: no respiratory distress Heart: distal pulses intact Abdomen: soft, nontender,  Uterine Fundus: firm, appropriately tender DVT Evaluation: No calf swelling or tenderness Extremities: No edema Skin: warm, dry  Recent Labs    05/03/19 2304 05/04/19 1155  HGB 10.5* 10.1*  HCT 33.7* 32.4*    Assessment/Plan: Taylor WAHLERTis a 23y.o. G1P1001 s/p SVD at 341w1d PPD#1 - Doing well  Routine postpartum care Contraception: Depo Feeding: Breast and bottle Dispo: Plan for discharge home 05/06/19.   LOS: 2 days   SiGerlene FeeD.O. Family Medicine Resident, PGY-1  05/05/2019, 8:08 AM   I personally saw and evaluated the patient, performing the key elements of the service. I developed and verified the management plan that is described in the resident's/student's note, and I agree with the content with my edits above. VSS, HRR&R, Resp unlabored, Legs neg.  FrNigel BertholdCNM 05/11/2019 7:49 PM

## 2019-05-05 NOTE — Anesthesia Postprocedure Evaluation (Signed)
Anesthesia Post Note  Patient: Taylor Reed  Procedure(s) Performed: AN AD [place]     Patient location during evaluation: Mother Baby Anesthesia Type: Epidural Level of consciousness: awake Pain management: satisfactory to patient Vital Signs Assessment: post-procedure vital signs reviewed and stable Respiratory status: spontaneous breathing Cardiovascular status: stable Anesthetic complications: no    Last Vitals:  Vitals:   05/05/19 0230 05/05/19 0535  BP: 103/62 107/71  Pulse: 86 78  Resp: 18 16  Temp: 36.6 C 36.7 C  SpO2: 100% 100%    Last Pain:  Vitals:   05/05/19 0539  TempSrc:   PainSc: 0-No pain   Pain Goal: Patients Stated Pain Goal: 2 (05/04/19 1757)                 Casimer Lanius

## 2019-05-06 MED ORDER — MEDROXYPROGESTERONE ACETATE 150 MG/ML IM SUSP: 150.0000 mg | INTRAMUSCULAR | 2 refills | Status: DC

## 2019-05-06 MED ORDER — TETANUS-DIPHTH-ACELL PERTUSSIS 5-2.5-18.5 LF-MCG/0.5 IM SUSP: 0.5000 mL | Freq: Once | INTRAMUSCULAR | 0 refills | Status: AC

## 2019-05-06 MED ORDER — IBUPROFEN 600 MG PO TABS: 600.0000 mg | ORAL_TABLET | Freq: Four times a day (QID) | ORAL | 0 refills | Status: DC

## 2019-05-06 NOTE — Discharge Instructions (Signed)
Medroxyprogesterone injection [Contraceptive] What is this medicine? MEDROXYPROGESTERONE (me DROX ee proe JES te rone) contraceptive injections prevent pregnancy. They provide effective birth control for 3 months. Depo-subQ Provera 104 is also used for treating pain related to endometriosis. This medicine may be used for other purposes; ask your health care provider or pharmacist if you have questions. COMMON BRAND NAME(S): Depo-Provera, Depo-subQ Provera 104 What should I tell my health care provider before I take this medicine? They need to know if you have any of these conditions:  frequently drink alcohol  asthma  blood vessel disease or a history of a blood clot in the lungs or legs  bone disease such as osteoporosis  breast cancer  diabetes  eating disorder (anorexia nervosa or bulimia)  high blood pressure  HIV infection or AIDS  kidney disease  liver disease  mental depression  migraine  seizures (convulsions)  stroke  tobacco smoker  vaginal bleeding  an unusual or allergic reaction to medroxyprogesterone, other hormones, medicines, foods, dyes, or preservatives  pregnant or trying to get pregnant  breast-feeding How should I use this medicine? Depo-Provera Contraceptive injection is given into a muscle. Depo-subQ Provera 104 injection is given under the skin. These injections are given by a health care professional. You must not be pregnant before getting an injection. The injection is usually given during the first 5 days after the start of a menstrual period or 6 weeks after delivery of a baby. Talk to your pediatrician regarding the use of this medicine in children. Special care may be needed. These injections have been used in female children who have started having menstrual periods. Overdosage: If you think you have taken too much of this medicine contact a poison control center or emergency room at once. NOTE: This medicine is only for you. Do not  share this medicine with others. What if I miss a dose? Try not to miss a dose. You must get an injection once every 3 months to maintain birth control. If you cannot keep an appointment, call and reschedule it. If you wait longer than 13 weeks between Depo-Provera contraceptive injections or longer than 14 weeks between Depo-subQ Provera 104 injections, you could get pregnant. Use another method for birth control if you miss your appointment. You may also need a pregnancy test before receiving another injection. What may interact with this medicine? Do not take this medicine with any of the following medications:  bosentan This medicine may also interact with the following medications:  aminoglutethimide  antibiotics or medicines for infections, especially rifampin, rifabutin, rifapentine, and griseofulvin  aprepitant  barbiturate medicines such as phenobarbital or primidone  bexarotene  carbamazepine  medicines for seizures like ethotoin, felbamate, oxcarbazepine, phenytoin, topiramate  modafinil  St. John's wort This list may not describe all possible interactions. Give your health care provider a list of all the medicines, herbs, non-prescription drugs, or dietary supplements you use. Also tell them if you smoke, drink alcohol, or use illegal drugs. Some items may interact with your medicine. What should I watch for while using this medicine? This drug does not protect you against HIV infection (AIDS) or other sexually transmitted diseases. Use of this product may cause you to lose calcium from your bones. Loss of calcium may cause weak bones (osteoporosis). Only use this product for more than 2 years if other forms of birth control are not right for you. The longer you use this product for birth control the more likely you will be at risk  for weak bones. Ask your health care professional how you can keep strong bones. You may have a change in bleeding pattern or irregular periods.  Many females stop having periods while taking this drug. If you have received your injections on time, your chance of being pregnant is very low. If you think you may be pregnant, see your health care professional as soon as possible. Tell your health care professional if you want to get pregnant within the next year. The effect of this medicine may last a long time after you get your last injection. What side effects may I notice from receiving this medicine? Side effects that you should report to your doctor or health care professional as soon as possible:  allergic reactions like skin rash, itching or hives, swelling of the face, lips, or tongue  breast tenderness or discharge  breathing problems  changes in vision  depression  feeling faint or lightheaded, falls  fever  pain in the abdomen, chest, groin, or leg  problems with balance, talking, walking  unusually weak or tired  yellowing of the eyes or skin Side effects that usually do not require medical attention (report to your doctor or health care professional if they continue or are bothersome):  acne  fluid retention and swelling  headache  irregular periods, spotting, or absent periods  temporary pain, itching, or skin reaction at site where injected  weight gain This list may not describe all possible side effects. Call your doctor for medical advice about side effects. You may report side effects to FDA at 1-800-FDA-1088. Where should I keep my medicine? This does not apply. The injection will be given to you by a health care professional. NOTE: This sheet is a summary. It may not cover all possible information. If you have questions about this medicine, talk to your doctor, pharmacist, or health care provider.  2020 Elsevier/Gold Standard (2008-10-26 18:37:56) Perinatal Depression When a woman feels excessive sadness, anger, or anxiety during pregnancy or during the first 12 months after she gives birth,  she has a condition called perinatal depression. Depression can interfere with work, school, relationships, and other everyday activities. If it is not managed properly, it can also cause problems in the mother and her baby. Sometimes, perinatal depression is left untreated because symptoms are thought to be normal mood swings during and right after pregnancy. If you have symptoms of depression, it is important to talk with your health care provider. What are the causes? The exact cause of this condition is not known. Hormonal changes during and after pregnancy may play a role in causing perinatal depression. What increases the risk? You are more likely to develop this condition if:  You have a personal or family history of depression, anxiety, or mood disorders.  You experience a stressful life event during pregnancy, such as the death of a loved one.  You have a lot of regular life stress.  You do not have support from family members or loved ones, or you are in an abusive relationship. What are the signs or symptoms? Symptoms of this condition include:  Feeling sad or hopeless.  Feelings of guilt.  Feeling irritable or overwhelmed.  Changes in your appetite.  Lack of energy or motivation.  Sleep problems.  Difficulty concentrating or completing tasks.  Loss of interest in hobbies or relationships.  Headaches or stomach problems that do not go away. How is this diagnosed? This condition is diagnosed based on a physical exam and mental evaluation.  In some cases, your health care provider may use a depression screening tool. These tools include a list of questions that can help a health care provider diagnose depression. Your health care provider may refer you to a mental health expert who specializes in depression. How is this treated? This condition may be treated with:  Medicines. Your health care provider will only give you medicines that have been proven safe for pregnancy  and breastfeeding.  Talk therapy with a mental health professional to help change your patterns of thinking (cognitive behavioral therapy).  Support groups.  Brain stimulation or light therapies.  Stress reduction therapies, such as mindfulness. Follow these instructions at home: Lifestyle  Do not use any products that contain nicotine or tobacco, such as cigarettes and e-cigarettes. If you need help quitting, ask your health care provider.  Do not use alcohol when you are pregnant. After your baby is born, limit alcohol intake to no more than 1 drink a day. One drink equals 12 oz of beer, 5 oz of wine, or 1 oz of hard liquor.  Consider joining a support group for new mothers. Ask your health care provider for recommendations.  Take good care of yourself. Make sure you: ? Get plenty of sleep. If you are having trouble sleeping, talk with your health care provider. ? Eat a healthy diet. This includes plenty of fruits and vegetables, whole grains, and lean proteins. ? Exercise regularly, as told by your health care provider. Ask your health care provider what exercises are safe for you. General instructions  Take over-the-counter and prescription medicines only as told by your health care provider.  Talk with your partner or family members about your feelings during pregnancy. Share any concerns or anxieties that you may have.  Ask for help with tasks or chores when you need it. Ask friends and family members to provide meals, watch your children, or help with cleaning.  Keep all follow-up visits as told by your health care provider. This is important. Contact a health care provider if:  You (or people close to you) notice that you have any symptoms of depression.  You have depression and your symptoms get worse.  You experience side effects from medicines, such as nausea or sleep problems. Get help right away if:  You feel like hurting yourself, your baby, or someone else. If  you ever feel like you may hurt yourself or others, or have thoughts about taking your own life, get help right away. You can go to your nearest emergency department or call:  Your local emergency services (911 in the [place]  A suicide crisis helpline, such as the National Suicide Prevention Lifeline at 239-798-72561-575-533-4704. This is open 24 hours a day. Summary  Perinatal depression is when a woman feels excessive sadness, anger, or anxiety during pregnancy or during the first 12 months after she gives birth.  If perinatal depression is not treated, it can lead to health problems for the mother and her baby.  This condition is treated with medicines, talk therapy, stress reduction therapies, or a combination of two or more treatments.  Talk with your partner or family members about your feelings. Do not be afraid to ask for help. This information is not intended to replace advice given to you by your health care provider. Make sure you discuss any questions you have with your health care provider. Document Released: 12/02/2016 Document Revised: 10/08/2017 Document Reviewed: 12/02/2016 Elsevier Patient Education  2020 [employer] Postpartum Baby Blues The  postpartum period begins right after the birth of a baby. During this time, there is often a lot of joy and excitement. It is also a time of many changes in the life of the parents. No matter how many times a mother gives birth, each child brings new challenges to the family, including different ways of relating to one another. It is common to have feelings of excitement along with confusing changes in moods, emotions, and thoughts. You may feel happy one minute and sad or stressed the next. These feelings of sadness usually happen in the period right after you have your baby, and they go away within a week or two. This is called the "baby blues." What are the causes? There is no known cause of baby blues. It is likely caused by a combination of  factors. However, changes in hormone levels after childbirth are believed to trigger some of the symptoms. Other factors that can play a role in these mood changes include:  Lack of sleep.  Stressful life events, such as poverty, caring for a loved one, or death of a loved one.  Genetics. What are the signs or symptoms? Symptoms of this condition include:  Brief changes in mood, such as going from extreme happiness to sadness.  Decreased concentration.  Difficulty sleeping.  Crying spells and tearfulness.  Loss of appetite.  Irritability.  Anxiety. If the symptoms of baby blues last for more than 2 weeks or become more severe, you may have postpartum depression. How is this diagnosed? This condition is diagnosed based on an evaluation of your symptoms. There are no medical or lab tests that lead to a diagnosis, but there are various questionnaires that a health care provider may use to identify women with the baby blues or postpartum depression. How is this treated? Treatment is not needed for this condition. The baby blues usually go away on their own in 1-2 weeks. Social support is often all that is needed. You will be encouraged to get adequate sleep and rest. Follow these instructions at home: Lifestyle      Get as much rest as you can. Take a nap when the baby sleeps.  Exercise regularly as told by your health care provider. Some women find yoga and walking to be helpful.  Eat a balanced and nourishing diet. This includes plenty of fruits and vegetables, whole grains, and lean proteins.  Do little things that you enjoy. Have a cup of tea, take a bubble bath, read your favorite magazine, or listen to your favorite music.  Avoid alcohol.  Ask for help with household chores, cooking, grocery shopping, or running errands. Do not try to do everything yourself. Consider hiring a postpartum doula to help. This is a professional who specializes in providing support to new  mothers.  Try not to make any major life changes during pregnancy or right after giving birth. This can add stress. General instructions  Talk to people close to you about how you are feeling. Get support from your partner, family members, friends, or other new moms. You may want to join a support group.  Find ways to cope with stress. This may include: ? Writing your thoughts and feelings in a journal. ? Spending time outside. ? Spending time with people who make you laugh.  Try to stay positive in how you think. Think about the things you are grateful for.  Take over-the-counter and prescription medicines only as told by your health care provider.  Let your health  care provider know if you have any concerns.  Keep all postpartum visits as told by your health care provider. This is important. Contact a health care provider if:  Your baby blues do not go away after 2 weeks. Get help right away if:  You have thoughts of taking your own life (suicidal thoughts).  You think you may harm the baby or other people.  You see or hear things that are not there (hallucinations). Summary  After giving birth, you may feel happy one minute and sad or stressed the next. Feelings of sadness that happen right after the baby is born and go away after a week or two are called the "baby blues."  You can manage the baby blues by getting enough rest, eating a healthy diet, exercising, spending time with supportive people, and finding ways to cope with stress.  If feelings of sadness and stress last longer than 2 weeks or get in the way of caring for your baby, talk to your health care provider. This may mean you have postpartum depression. This information is not intended to replace advice given to you by your health care provider. Make sure you discuss any questions you have with your health care provider. Document Released: 07/09/2004 Document Revised: 01/27/2019 Document Reviewed:  12/01/2016 Elsevier Patient Education  2020 [employer].

## 2019-05-06 NOTE — Lactation Note (Addendum)
This note was copied from a baby's chart. Lactation Consultation Note:   infant is 47 hours and is a 37.1 week.  Mother reports that she is not attempting to breastfeed infant. She also reports that she  has pumped her breast once.  Mother still unsure if she will breastfeed infant.   Infant is taking 30-63 ml of formula a with bottle.  Discussed supply and demand.   Discussed treatment and prevention of engorgement.  Mother pleasant and receptive to all teaching.  Advised her to page if she wants any breastfeeding assistance. Mother is active with [facility]. She has all information on breastfeeding resources.   Patient Name: Taylor Reed [id number] Date: 05/06/2019 Reason for consult: Follow-up assessment   Maternal Data    Feeding Feeding Type: Bottle Fed - Formula Nipple Type: Slow - flow  LATCH Score                   Interventions Interventions: Hand pump  Lactation Tools Discussed/Used     Consult Status      [name] 05/06/2019, 10:23 AM

## 2019-05-10 ENCOUNTER — Telehealth: Payer: Self-pay

## 2019-05-10 NOTE — Telephone Encounter (Signed)
Returned call, no answer, left vm 

## 2019-05-11 ENCOUNTER — Ambulatory Visit (INDEPENDENT_AMBULATORY_CARE_PROVIDER_SITE_OTHER): Payer: Medicaid Other

## 2019-05-11 ENCOUNTER — Other Ambulatory Visit: Payer: Self-pay

## 2019-05-11 VITALS — BP 121/80 | HR 90

## 2019-05-11 DIAGNOSIS — Z8759 Personal history of other complications of pregnancy, childbirth and the puerperium: Secondary | ICD-10-CM

## 2019-05-11 NOTE — Progress Notes (Signed)
Agree with A & P. 

## 2019-05-11 NOTE — Progress Notes (Signed)
Subjective:  Taylor Reed is a 23 y.o. female hx of GTHN, here for postpartum BP check. NSVD 05/04/2019  Hypertension ROS: no TIA's, no chest pain on exertion, no dyspnea on exertion and no swelling of ankles.    Objective:  BP 121/80   Pulse 90   LMP 08/17/2018   Appearance alert, well appearing, and in no distress. General exam BP noted to be well controlled today in office.    Assessment:   Blood Pressure well controlled.   Plan:  Keep upcoming routine pp visit.

## 2019-06-01 ENCOUNTER — Encounter: Payer: Self-pay | Admitting: Certified Nurse Midwife

## 2019-06-01 ENCOUNTER — Ambulatory Visit (INDEPENDENT_AMBULATORY_CARE_PROVIDER_SITE_OTHER): Payer: Medicaid Other | Admitting: Certified Nurse Midwife

## 2019-06-01 DIAGNOSIS — Z3202 Encounter for pregnancy test, result negative: Secondary | ICD-10-CM | POA: Diagnosis not present

## 2019-06-01 DIAGNOSIS — Z1389 Encounter for screening for other disorder: Secondary | ICD-10-CM

## 2019-06-01 DIAGNOSIS — Z30013 Encounter for initial prescription of injectable contraceptive: Secondary | ICD-10-CM

## 2019-06-01 LAB — POCT URINE PREGNANCY: Preg Test, Ur: NEGATIVE

## 2019-06-01 NOTE — Progress Notes (Signed)
Post Partum Exam  Taylor Reed is a 23 y.o. G51P1001 female who presents for a postpartum visit. She is 4 weeks postpartum   postpartum following a vaginal delivery:05/04/2019. I have fully reviewed the prenatal and intrapartum course. The delivery was at 37 gestational weeks.  Anesthesia: Epidual. Postpartum course has been uncomplicated. Baby's course has been uncomplicated. Baby is feeding by bottle. Bleeding not at this time. Bowel function is normal. Bladder function is normal. Patient is sexually active. Contraception method is depo-provera. Postpartum depression screening:neg  The following portions of the patient's history were reviewed and updated as appropriate: allergies, current medications, past surgical history and problem list. Last pap smear done 12/01/2018 and was normal  Review of Systems A comprehensive review of systems was negative.    Objective:  Last menstrual period 08/17/2018, unknown if currently breastfeeding.  General:  alert, cooperative and no distress   Breasts:  inspection negative, no nipple discharge or bleeding, no masses or nodularity palpable  Lungs: clear to auscultation bilaterally  Heart:  regular rate and rhythm  Abdomen: soft, non-tender; bowel sounds normal; no masses,  no organomegaly   Vulva:  not evaluated  Vagina: not evaluated  Cervix:  not evaluated  Corpus: not examined  Adnexa:  not evaluated  Rectal Exam: Not performed.        Assessment/Plan:  1. Postpartum care and examination -  Normal postpartum exam. Pap smear not done at today's visit. Recent pap on 11/2018- negative for abnormalities  - Patient reports recent unprotected IC on 05/19/2019- will not start depo today as in 2 week window   2. Encounter for prescription for depo-Provera - Depo not done today d/t recent IC  - Patient to return on Monday for depo initiation  - Discussed with patient importance of abstaining from [place] until after that appointment, patient verbalizes  understanding  - POCT urine pregnancy  Contraception: Depo-Provera injections Follow up on Monday for Depo   Lajean Manes, CNM 06/01/19, 10:01 AM

## 2019-06-05 ENCOUNTER — Ambulatory Visit: Payer: Medicaid Other

## 2019-06-13 ENCOUNTER — Telehealth: Payer: Self-pay | Admitting: Obstetrics

## 2019-10-09 ENCOUNTER — Ambulatory Visit
Admission: EM | Admit: 2019-10-09 | Discharge: 2019-10-09 | Disposition: A | Discharge status: Home / Self Care | Payer: Medicaid Other | Attending: Physician Assistant | Admitting: Physician Assistant

## 2019-10-09 DIAGNOSIS — Z3201 Encounter for pregnancy test, result positive: Secondary | ICD-10-CM | POA: Diagnosis not present

## 2019-10-09 LAB — POCT URINE PREGNANCY: Preg Test, Ur: POSITIVE — AB

## 2019-10-09 NOTE — Discharge Instructions (Signed)
Urine pregnancy positive. Stop ibuprofen/alcohol. Start prenatal  vitamins. Follow up with OBGYN for further evaluation and management needed. If having vaginal bleeding, abdominal pain, go to the women's ED for further evaluation needed.

## 2019-10-09 NOTE — ED Provider Notes (Signed)
[facility]    CSN: [id number] Arrival date & time: 10/09/19  1657      History   Chief Complaint Chief Complaint  Patient presents with  . wants pregnancy test    HPI Taylor Reed is a 23 y.o. female.   23 year old female comes in for pregnancy test.  States she is 2 weeks late for her cycle.  Active past few days, has had nausea without vomiting.  She denies any abdominal pain.  Denies urinary symptoms such as frequency, dysuria, hematuria.  Denies vaginal discharge, bleeding.  LMP 08/24/2019.     Past Medical History:  Diagnosis Date  . Depression   . DUB (dysfunctional uterine bleeding)   . Encounter for supervision of normal first pregnancy in third trimester 12/01/2018    Nursing Staff Provider Office Location  [facility] Dating  LMP c/w 93 w sono Language  English  Anatomy US   incomplete- f/u scheduled-Nml Flu Vaccine   declined 12/01/18 Genetic Screen  NIPS: low risks  AFP:  neg  TDaP vaccine   declined 03/01/19 Hgb A1C or  GTT Early  Third trimester: Nml Rhogam  NA   LAB RESULTS  Feeding Plan  Both Blood Type O/Positive/-- (02/13 1530)  Contraception  Depo Antibody   . GC (gonococcus infection) 07/09/2014  . Seasonal allergies   . UTI (urinary tract infection) 07/09/2014    Patient Active Problem List   Diagnosis Date Noted  . SVD (spontaneous vaginal delivery) 05/04/2019  . Gestational hypertension, third trimester 05/03/2019  . Gestational hypertension 04/05/2019  . Maternal morbid obesity, antepartum ([place]) 12/01/2018  . 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 Acne 07/02/2015  . Herpes genitalia 11/08/2014    Past Surgical History:  Procedure Laterality Date  . NO PAST SURGERIES      OB History    Gravida  1   Para  1   Term  1   Preterm      AB      Living  1     SAB      TAB      Ectopic      Multiple  0   Live Births  1            Home Medications    Prior to Admission medications   Not on File    Family History  Family History  Problem Relation Age of Onset  . Cancer Mother     Social History Social History   Tobacco Use  . Smoking status: Current Every Day Smoker    Types: Cigarettes  . Smokeless tobacco: Never Used  . Tobacco comment: none since dec 2019  Substance Use Topics  . Alcohol use: Not Currently  . Drug use: Not Currently     Allergies   Latex   Review of Systems Review of Systems  Reason unable to perform ROS: See HPI as above.     Physical Exam Triage Vital Signs ED Triage Vitals  Enc Vitals Group     BP 10/09/19 1703 137/81     Pulse Rate 10/09/19 1703 98     Resp 10/09/19 1703 16     Temp 10/09/19 1703 98.5 F (36.9 C)     Temp Source 10/09/19 1703 Oral     SpO2 10/09/19 1703 98 %     Weight --      Height --      Head Circumference --      Peak Flow --  Pain Score 10/09/19 1713 0     Pain Loc --      Pain Edu? --      Excl. in GC? --    No data found.  Updated Vital Signs BP 137/81 (BP Location: Left Arm)   Pulse 98   Temp 98.5 F (36.9 C) (Oral)   Resp 16   LMP 08/24/2019   SpO2 98%   Physical Exam Constitutional:      General: She is not in acute distress.    Appearance: She is well-developed. She is not ill-appearing, toxic-appearing or diaphoretic.  HENT:     Head: Normocephalic and atraumatic.  Eyes:     Conjunctiva/sclera: Conjunctivae normal.     Pupils: Pupils are equal, round, and reactive to light.  Cardiovascular:     Rate and Rhythm: Normal rate and regular rhythm.     Heart sounds: Normal heart sounds. No murmur. No friction rub. No gallop.   Pulmonary:     Effort: Pulmonary effort is normal.     Breath sounds: Normal breath sounds. No wheezing or rales.  Abdominal:     General: Bowel sounds are normal.     Palpations: Abdomen is soft.     Tenderness: There is no abdominal tenderness. There is no right CVA tenderness, left CVA tenderness, guarding or rebound.  Skin:    General: Skin is warm and dry.   Neurological:     Mental Status: She is alert and oriented to person, place, and time.  Psychiatric:        Behavior: Behavior normal.        Judgment: Judgment normal.      UC Treatments / Results  Labs (all labs ordered are listed, but only abnormal results are displayed) Labs Reviewed  POCT URINE PREGNANCY - Abnormal; Notable for the following components:      Result Value   Preg Test, Ur Positive (*)    All other components within normal limits    EKG   Radiology No results found.  Procedures Procedures (including critical care time)  Medications Ordered in UC Medications - No data to display  Initial Impression / Assessment and Plan / UC Course  I have reviewed the triage vital signs and the nursing notes.  Pertinent labs & imaging results that were available during my care of the patient were reviewed by me and considered in my medical decision making (see chart for details).    Urine pregnancy positive.  Patient to start prenatal vitamins.  Medications none foods to avoid discussed.  Follow-up with OB/GYN for further evaluation management needed.  Return precautions given.  Patient expresses understanding and agrees to plan.  Final Clinical Impressions(s) / UC Diagnoses   Final diagnoses:  Positive urine pregnancy test    ED Prescriptions    None     PDMP not reviewed this encounter.   Belinda Fisher, PA-C 10/09/19 1739

## 2019-10-09 NOTE — ED Triage Notes (Signed)
Pt requesting a preg test. States l2wks late on her menstrual cycle, feeling nausea for past few days. States took 2 home preg test but the line was very faint.

## 2019-10-20 NOTE — L&D Delivery Note (Addendum)
OB[facility]N [facility] Practice Delivery Note  Taylor Reed is a 24 y.o. G2P1001 s/p SVD at [redacted]w[redacted]d. She was admitted for IOL for postdates.   ROM: 0h 3m with light meconium-stained fluid GBS Status: Positive, received penicillin Maximum Maternal Temperature: 98.8  Labor Progress: Patient presented for IOL for postdates and received cytotec x 1 at 0919. Started on pitocin at 1619, held at 1813 following epidural placement for recurrent variables and low maternal BP which resolved after phenylephrine x 2 and 250cc LR bolus. AROM at 2124. Noted to be completely dilated at 2204.  Delivery Date/Time: 06/06/20 22:11 Delivery: Called to room and patient was complete and pushing. Head delivered ROA. No nuchal cord present. Shoulder and body delivered in usual fashion. Infant with spontaneous cry, placed on mother's abdomen, dried and stimulated. Cord clamped x 2 after 1-minute delay, and cut by FOB under my direct supervision. Cord blood drawn. Placenta delivered spontaneously with gentle cord traction. Fundus firm with massage and Pitocin. Labia, perineum, vagina, and cervix were inspected, noted to have a left periurethral abrasion that was hemostatic without repair.   Placenta: Intact, 3 vessel cord, delivered spontaneously Complications: None Lacerations: Left periurethral abrasion, hemostatic without repair QBL: 229 Analgesia: Epidural   Infant: Girl  APGARs 8, 9  Weight pending   Nelda Bucks, MD Family Medicine PGY-3  The above was performed under my direct supervision and guidance.

## 2019-11-15 ENCOUNTER — Encounter: Payer: Self-pay | Admitting: Certified Nurse Midwife

## 2019-11-15 ENCOUNTER — Other Ambulatory Visit: Payer: Self-pay

## 2019-11-15 ENCOUNTER — Other Ambulatory Visit (HOSPITAL_COMMUNITY)
Admission: RE | Admit: 2019-11-15 | Discharge: 2019-11-15 | Disposition: A | Discharge status: Home / Self Care | Payer: Medicaid Other | Source: Ambulatory Visit | Attending: Certified Nurse Midwife | Admitting: Certified Nurse Midwife

## 2019-11-15 ENCOUNTER — Ambulatory Visit (INDEPENDENT_AMBULATORY_CARE_PROVIDER_SITE_OTHER): Payer: Medicaid Other | Admitting: Certified Nurse Midwife

## 2019-11-15 VITALS — BP 115/77 | HR 90 | Wt 230.8 lb

## 2019-11-15 DIAGNOSIS — O09899 Supervision of other high risk pregnancies, unspecified trimester: Secondary | ICD-10-CM

## 2019-11-15 DIAGNOSIS — Z8759 Personal history of other complications of pregnancy, childbirth and the puerperium: Secondary | ICD-10-CM | POA: Diagnosis not present

## 2019-11-15 DIAGNOSIS — O09892 Supervision of other high risk pregnancies, second trimester: Secondary | ICD-10-CM

## 2019-11-15 DIAGNOSIS — Z3480 Encounter for supervision of other normal pregnancy, unspecified trimester: Secondary | ICD-10-CM | POA: Diagnosis not present

## 2019-11-15 DIAGNOSIS — Z3A16 16 weeks gestation of pregnancy: Secondary | ICD-10-CM

## 2019-11-15 DIAGNOSIS — Z3A11 11 weeks gestation of pregnancy: Secondary | ICD-10-CM | POA: Diagnosis not present

## 2019-11-15 DIAGNOSIS — O99212 Obesity complicating pregnancy, second trimester: Secondary | ICD-10-CM | POA: Diagnosis not present

## 2019-11-15 DIAGNOSIS — Z362 Encounter for other antenatal screening follow-up: Secondary | ICD-10-CM

## 2019-11-15 DIAGNOSIS — Z315 Encounter for genetic counseling: Secondary | ICD-10-CM | POA: Diagnosis not present

## 2019-11-15 DIAGNOSIS — Z3481 Encounter for supervision of other normal pregnancy, first trimester: Secondary | ICD-10-CM | POA: Diagnosis not present

## 2019-11-15 DIAGNOSIS — O99210 Obesity complicating pregnancy, unspecified trimester: Secondary | ICD-10-CM | POA: Diagnosis not present

## 2019-11-15 DIAGNOSIS — R8271 Bacteriuria: Secondary | ICD-10-CM

## 2019-11-15 HISTORY — DX: Personal history of other complications of pregnancy, childbirth and the puerperium: Z87.59

## 2019-11-15 MED ORDER — PRENATAL GUMMIES/DHA & FA 0.4-32.5 MG PO CHEW: 3.0000 | CHEWABLE_TABLET | Freq: Every day | ORAL | 3 refills | Status: AC

## 2019-11-15 MED ORDER — ASPIRIN EC 81 MG PO TBEC: 81.0000 mg | DELAYED_RELEASE_TABLET | Freq: Every day | ORAL | 0 refills | Status: DC

## 2019-11-15 NOTE — Patient Instructions (Signed)
First Trimester of Pregnancy  The first trimester of pregnancy is from week 1 until the end of week 13 (months 1 through 3). During this time, your baby will begin to develop inside you. At 6-8 weeks, the eyes and face are formed, and the heartbeat can be seen on ultrasound. At the end of 12 weeks, all the baby's organs are formed. Prenatal care is all the medical care you receive before the birth of your baby. Make sure you get good prenatal care and follow all of your doctor's instructions. Follow these instructions at home: Medicines  Take over-the-counter and prescription medicines only as told by your doctor. Some medicines are safe and some medicines are not safe during pregnancy.  Take a prenatal vitamin that contains at least 600 micrograms (mcg) of folic acid.  If you have trouble pooping (constipation), take medicine that will make your stool soft (stool softener) if your doctor approves. Eating and drinking   Eat regular, healthy meals.  Your doctor will tell you the amount of weight gain that is right for you.  Avoid raw meat and uncooked cheese.  If you feel sick to your stomach (nauseous) or throw up (vomit): ? Eat 4 or 5 small meals a day instead of 3 large meals. ? Try eating a few soda crackers. ? Drink liquids between meals instead of during meals.  To prevent constipation: ? Eat foods that are high in fiber, like fresh fruits and vegetables, whole grains, and beans. ? Drink enough fluids to keep your pee (urine) clear or pale yellow. Activity  Exercise only as told by your doctor. Stop exercising if you have cramps or pain in your lower belly (abdomen) or low back.  Do not exercise if it is too hot, too humid, or if you are in a place of great height (high altitude).  Try to avoid standing for long periods of time. Move your legs often if you must stand in one place for a long time.  Avoid heavy lifting.  Wear low-heeled shoes. Sit and stand up  straight.  You can have sex unless your doctor tells you not to. Relieving pain and discomfort  Wear a good support bra if your breasts are sore.  Take warm water baths (sitz baths) to soothe pain or discomfort caused by hemorrhoids. Use hemorrhoid cream if your doctor says it is okay.  Rest with your legs raised if you have leg cramps or low back pain.  If you have puffy, bulging veins (varicose veins) in your legs: ? Wear support hose or compression stockings as told by your doctor. ? Raise (elevate) your feet for 15 minutes, 3-4 times a day. ? Limit salt in your food. Prenatal care  Schedule your prenatal visits by the twelfth week of pregnancy.  Write down your questions. Take them to your prenatal visits.  Keep all your prenatal visits as told by your doctor. This is important. Safety  Wear your seat belt at all times when driving.  Make a list of emergency phone numbers. The list should include numbers for family, friends, the hospital, and police and fire departments. General instructions  Ask your doctor for a referral to a local prenatal class. Begin classes no later than at the start of month 6 of your pregnancy.  Ask for help if you need counseling or if you need help with nutrition. Your doctor can give you advice or tell you where to go for help.  Do not use hot tubs, steam   rooms, or saunas.  Do not douche or use tampons or scented sanitary pads.  Do not cross your legs for long periods of time.  Avoid all herbs and alcohol. Avoid drugs that are not approved by your doctor.  Do not use any tobacco products, including cigarettes, chewing tobacco, and electronic cigarettes. If you need help quitting, ask your doctor. You may get counseling or other support to help you quit.  Avoid cat litter boxes and soil used by cats. These carry germs that can cause birth defects in the baby and can cause a loss of your baby (miscarriage) or stillbirth.  Visit your dentist.  At home, brush your teeth with a soft toothbrush. Be gentle when you floss. Contact a doctor if:  You are dizzy.  You have mild cramps or pressure in your lower belly.  You have a nagging pain in your belly area.  You continue to feel sick to your stomach, you throw up, or you have watery poop (diarrhea).  You have a bad smelling fluid coming from your vagina.  You have pain when you pee (urinate).  You have increased puffiness (swelling) in your face, hands, legs, or ankles. Get help right away if:  You have a fever.  You are leaking fluid from your vagina.  You have spotting or bleeding from your vagina.  You have very bad belly cramping or pain.  You gain or lose weight rapidly.  You throw up blood. It may look like coffee grounds.  You are around people who have German measles, fifth disease, or chickenpox.  You have a very bad headache.  You have shortness of breath.  You have any kind of trauma, such as from a fall or a car accident. Summary  The first trimester of pregnancy is from week 1 until the end of week 13 (months 1 through 3).  To take care of yourself and your unborn baby, you will need to eat healthy meals, take medicines only if your doctor tells you to do so, and do activities that are safe for you and your baby.  Keep all follow-up visits as told by your doctor. This is important as your doctor will have to ensure that your baby is healthy and growing well. This information is not intended to replace advice given to you by your health care provider. Make sure you discuss any questions you have with your health care provider. Document Revised: 01/26/2019 Document Reviewed: 10/13/2016 Elsevier Patient Education  2020 Elsevier Inc.  

## 2019-11-15 NOTE — Progress Notes (Signed)
Pt is here for initial OB visit. LMP 08/24/19.

## 2019-11-16 DIAGNOSIS — O09899 Supervision of other high risk pregnancies, unspecified trimester: Secondary | ICD-10-CM | POA: Insufficient documentation

## 2019-11-16 LAB — URINE CYTOLOGY ANCILLARY ONLY
Chlamydia: NEGATIVE
Comment: NEGATIVE
Comment: NORMAL
Neisseria Gonorrhea: NEGATIVE

## 2019-11-16 LAB — PROTEIN / CREATININE RATIO, URINE
Creatinine, Urine: 230.8 mg/dL
Protein, Ur: 9.8 mg/dL
Protein/Creat Ratio: 42 mg/g creat (ref 0–200)

## 2019-11-16 NOTE — Progress Notes (Signed)
History:   Taylor Reed is a 24 y.o. G2P1001 at 15w6dby LMP being seen today for her first obstetrical visit.  Her obstetrical history is significant for obesity, hx of GHTN and short interval between pregnancies.  Patient does intend to breast feed. Pregnancy history fully reviewed.  This was not a planned pregnancy and patient unsure at this time of plans. Patient emotional at visit. Support given to patient. Encouraged patient to have conversation with family and FOB to discussed options and plans.   Patient reports no complaints.     HISTORY: OB History  Gravida Para Term Preterm AB Living  '2 1 1 ' 0 0 1  SAB TAB Ectopic Multiple Live Births  0 0 0 0 1    # Outcome Date GA Lbr Len/2nd Weight Sex Delivery Anes PTL Lv  2 Current           1 Term 05/04/19 330w1d6:10 / 02:25 6 lb 11.1 oz (3.035 kg) M Vag-Spont EPI  LIV     Name: Teaney,BOY Jasani     Apgar1: 8  Apgar5: 9    Last pap smear was done 11/2018 and was normal  Past Medical History:  Diagnosis Date  . Depression   . DUB (dysfunctional uterine bleeding)   . Encounter for supervision of normal first pregnancy in third trimester 12/01/2018    Nursing Staff Provider Office Location  [facility] Dating  LMP c/w 1926 sono Language  English  Anatomy [place] incomplete- f/u scheduled-Nml Flu Vaccine   declined 12/01/18 Genetic Screen  NIPS: low risks  AFP:  neg  TDaP vaccine   declined 03/01/19 Hgb A1C or  GTT Early  Third trimester: Nml Rhogam  NA   LAB RESULTS  Feeding Plan  Both Blood Type O/Positive/-- (02/13 1530)  Contraception  Depo Antibody   . GC (gonococcus infection) 07/09/2014  . Gestational hypertension 04/05/2019   Diagnosed 6/17  . Gestational hypertension, third trimester 05/03/2019  . Seasonal allergies   . UTI (urinary tract infection) 07/09/2014   Past Surgical History:  Procedure Laterality Date  . NO PAST SURGERIES     Family History  Problem Relation Age of Onset  . Cancer Mother    Social History    Tobacco Use  . Smoking status: Current Every Day Smoker    Types: Cigarettes  . Smokeless tobacco: Never Used  . Tobacco comment: none since dec 2019  Substance Use Topics  . Alcohol use: Not Currently  . Drug use: Not Currently   Allergies  Allergen Reactions  . Latex Rash   No current outpatient medications on file prior to visit.   No current facility-administered medications on file prior to visit.    Review of Systems Pertinent items noted in HPI and remainder of comprehensive ROS otherwise negative. Physical Exam:   Vitals:   11/15/19 1304  BP: 115/77  Pulse: 90  Weight: 230 lb 12.8 oz (104.7 kg)   Fetal Heart Rate (bpm): US-160 System: General: well-developed, well-nourished female in no acute distress   Breasts:  normal appearance, no masses or tenderness bilaterally   Skin: normal coloration and turgor, no rashes   Neurologic: oriented, normal, negative, normal mood   Extremities: normal strength, tone, and muscle mass, ROM of all joints is normal   HEENT PERRLA, extraocular movement intact and sclera clear   Mouth/Teeth mucous membranes moist, pharynx normal without lesions and dental hygiene good   Neck supple and no masses   Cardiovascular:  regular rate and rhythm   Respiratory:  no respiratory distress, normal breath sounds   Abdomen: soft, non-tender; bowel sounds normal; no masses,  no organomegaly  Bedside Ultrasound for FHR check: Patient informed that the ultrasound is considered a limited obstetric ultrasound and is not intended to be a complete ultrasound exam.  Patient also informed that the ultrasound is not being completed with the intent of assessing for fetal or placental anomalies or any pelvic abnormalities.  Explained that the purpose of today's ultrasound is to assess for fetal heart rate.  Patient acknowledges the purpose of the exam and the limitations of the study.      FHR 160 by bedside US   Assessment:    Pregnancy:  G2P1001 Patient Active Problem List   Diagnosis Date Noted  . Short interval between pregnancies complicating pregnancy, antepartum 11/16/2019  . Supervision of other normal pregnancy, antepartum 11/15/2019  . History of gestational hypertension 11/15/2019  . SVD (spontaneous vaginal delivery) 05/04/2019  . Maternal morbid obesity, antepartum ([place]) 12/01/2018  . 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 Acne 07/02/2015  . Herpes genitalia 11/08/2014     Plan:    1. Supervision of other normal pregnancy, antepartum - Anticipatory guidance on upcoming appointments  - Encouraged patient to call with any questions or concerns - Patient request Rx for prenatal vitamins - prefers gummies   - Obstetric Panel, Including HIV - Culture, OB Urine - Genetic Screening - Urine cytology ancillary only([place]) - Prenatal MV-Min-FA-Omega-3 (PRENATAL GUMMIES/DHA & FA) 0.4-32.5 MG CHEW; Chew 3 tablets by mouth daily.  Dispense: 90 tablet; Refill: 3  2. Obesity affecting pregnancy, antepartum - BMI 39.62  - bASA initiated for patient to start tomorrow  - aspirin EC 81 MG tablet; Take 1 tablet (81 mg total) by mouth daily. Take after 12 weeks for prevention of preeclampsia later in pregnancy  Dispense: 300 tablet; Refill: 0 - HgB A1c  3. History of gestational hypertension - Baseline labs obtained today  - Comp Met (CMET) - Protein / creatinine ratio, urine  4. Short interval between pregnancies complicating pregnancy, antepartum - Recent delivery in July 2020    Initial labs drawn. Pick up prenatal vitamins from pharmacy and begin taking  Genetic Screening discussed, NIPS: ordered. Ultrasound discussed; fetal anatomic survey: requested. Problem list reviewed and updated. The nature of [facility] with multiple MDs and other Advanced Practice Providers was explained to patient; also emphasized that residents, students are part of our team. Routine obstetric  precautions reviewed. Return in about 4 weeks (around 12/13/2019) for ROB-mychart.      Lajean Manes, [place] for [employer], [place]

## 2019-11-17 LAB — OBSTETRIC PANEL, INCLUDING HIV
Antibody Screen: NEGATIVE
Basophils Absolute: 0 10*3/uL (ref 0.0–0.2)
Basos: 0 %
EOS (ABSOLUTE): 0.1 10*3/uL (ref 0.0–0.4)
Eos: 2 %
HIV Screen 4th Generation wRfx: NONREACTIVE
Hematocrit: 36.6 % (ref 34.0–46.6)
Hemoglobin: 12.3 g/dL (ref 11.1–15.9)
Hepatitis B Surface Ag: NEGATIVE
Immature Grans (Abs): 0 10*3/uL (ref 0.0–0.1)
Immature Granulocytes: 0 %
Lymphocytes Absolute: 2.1 10*3/uL (ref 0.7–3.1)
Lymphs: 29 %
MCH: 30.8 pg (ref 26.6–33.0)
MCHC: 33.6 g/dL (ref 31.5–35.7)
MCV: 92 fL (ref 79–97)
Monocytes Absolute: 0.7 10*3/uL (ref 0.1–0.9)
Monocytes: 10 %
Neutrophils Absolute: 4.4 10*3/uL (ref 1.4–7.0)
Neutrophils: 59 %
Platelets: 273 10*3/uL (ref 150–450)
RBC: 4 x10E6/uL (ref 3.77–5.28)
RDW: 14 % (ref 11.7–15.4)
RPR Ser Ql: REACTIVE — AB
Rh Factor: POSITIVE
Rubella Antibodies, IGG: 2.12 index (ref >0.99)
WBC: 7.4 10*3/uL (ref 3.4–10.8)

## 2019-11-17 LAB — RPR, QUANT+TP ABS (REFLEX)
Rapid Plasma Reagin, Quant: 1:1 {titer} — ABNORMAL HIGH
T Pallidum Abs: NONREACTIVE

## 2019-11-17 LAB — COMPREHENSIVE METABOLIC PANEL
ALT: 11 IU/L (ref 0–32)
AST: 15 IU/L (ref 0–40)
Albumin/Globulin Ratio: 1.3 (ref 1.2–2.2)
Albumin: 3.7 g/dL — ABNORMAL LOW (ref 3.9–5.0)
Alkaline Phosphatase: 59 IU/L (ref 39–117)
BUN/Creatinine Ratio: 11 (ref 9–23)
BUN: 8 mg/dL (ref 6–20)
Bilirubin Total: <0.2 mg/dL (ref 0.0–1.2)
CO2: 20 mmol/L (ref 20–29)
Calcium: 9.7 mg/dL (ref 8.7–10.2)
Chloride: 104 mmol/L (ref 96–106)
Creatinine, Ser: 0.7 mg/dL (ref 0.57–1.00)
GFR calc Af Amer: 141 mL/min/{1.73_m2} (ref >59)
GFR calc non Af Amer: 123 mL/min/{1.73_m2} (ref >59)
Globulin, Total: 2.8 g/dL (ref 1.5–4.5)
Glucose: 83 mg/dL (ref 65–99)
Potassium: 4.6 mmol/L (ref 3.5–5.2)
Sodium: 138 mmol/L (ref 134–144)
Total Protein: 6.5 g/dL (ref 6.0–8.5)

## 2019-11-17 LAB — HEMOGLOBIN A1C
Est. average glucose Bld gHb Est-mCnc: 100 mg/dL
Hgb A1c MFr Bld: 5.1 % (ref 4.8–5.6)

## 2019-11-21 ENCOUNTER — Encounter: Payer: Self-pay | Admitting: Certified Nurse Midwife

## 2019-11-22 ENCOUNTER — Telehealth: Payer: Self-pay

## 2019-11-22 ENCOUNTER — Other Ambulatory Visit: Payer: Self-pay

## 2019-11-22 DIAGNOSIS — Z3480 Encounter for supervision of other normal pregnancy, unspecified trimester: Secondary | ICD-10-CM

## 2019-11-22 DIAGNOSIS — R8271 Bacteriuria: Secondary | ICD-10-CM | POA: Insufficient documentation

## 2019-11-22 LAB — URINE CULTURE, OB REFLEX

## 2019-11-22 LAB — CULTURE, OB URINE

## 2019-11-22 MED ORDER — PREPLUS 27-1 MG PO TABS: 1.0000 | ORAL_TABLET | Freq: Every day | ORAL | 13 refills | Status: DC

## 2019-11-22 MED ORDER — CEFADROXIL 500 MG PO CAPS: 500.0000 mg | ORAL_CAPSULE | Freq: Two times a day (BID) | ORAL | 0 refills | Status: DC

## 2019-11-22 NOTE — Progress Notes (Signed)
Prenatal vitamin rx sent covered by medicaid.

## 2019-11-22 NOTE — Addendum Note (Signed)
Addended by: Sharyon Cable on: 11/22/2019 11:34 AM   Modules accepted: Orders

## 2019-11-22 NOTE — Telephone Encounter (Signed)
Pts mom LVM on nurse line requesting prenatal vitamin covered by medicaid be sent to pts pharmacy. Rx prenatals sent. Called pt back to let her know, no answer. LVM letting pt know prenatals are at her pharmacy.

## 2019-11-30 ENCOUNTER — Encounter: Payer: Self-pay | Admitting: Certified Nurse Midwife

## 2019-12-14 ENCOUNTER — Telehealth (INDEPENDENT_AMBULATORY_CARE_PROVIDER_SITE_OTHER): Payer: Medicaid Other | Admitting: Certified Nurse Midwife

## 2019-12-14 ENCOUNTER — Encounter: Payer: Self-pay | Admitting: Certified Nurse Midwife

## 2019-12-14 VITALS — BP 113/89 | HR 95

## 2019-12-14 DIAGNOSIS — O09899 Supervision of other high risk pregnancies, unspecified trimester: Secondary | ICD-10-CM

## 2019-12-14 DIAGNOSIS — Z3A16 16 weeks gestation of pregnancy: Secondary | ICD-10-CM | POA: Diagnosis not present

## 2019-12-14 DIAGNOSIS — Z8759 Personal history of other complications of pregnancy, childbirth and the puerperium: Secondary | ICD-10-CM

## 2019-12-14 DIAGNOSIS — O09292 Supervision of pregnancy with other poor reproductive or obstetric history, second trimester: Secondary | ICD-10-CM

## 2019-12-14 DIAGNOSIS — O98812 Other maternal infectious and parasitic diseases complicating pregnancy, second trimester: Secondary | ICD-10-CM

## 2019-12-14 DIAGNOSIS — R8271 Bacteriuria: Secondary | ICD-10-CM | POA: Diagnosis not present

## 2019-12-14 DIAGNOSIS — Z3480 Encounter for supervision of other normal pregnancy, unspecified trimester: Secondary | ICD-10-CM

## 2019-12-14 MED ORDER — CEFADROXIL 500 MG PO CAPS: 500.0000 mg | ORAL_CAPSULE | Freq: Two times a day (BID) | ORAL | 0 refills | Status: DC

## 2019-12-14 NOTE — Progress Notes (Signed)
Virtual ROB   CC: None  

## 2019-12-14 NOTE — Progress Notes (Signed)
TELEHEALTH VIRTUAL OBSTETRICS VISIT ENCOUNTER NOTE  I connected with Taylor Reed on 12/14/19 at  9:31 AM EST by telephone at home and verified that I am speaking with the correct person using two identifiers.   I discussed the limitations, risks, security and privacy concerns of performing an evaluation and management service by telephone and the availability of in person appointments. I also discussed with the patient that there may be a patient responsible charge related to this service. The patient expressed understanding and agreed to proceed.  Subjective:  Taylor Reed is a 24 y.o. G2P1001 at [redacted]w[redacted]d being followed for ongoing prenatal care.  She is currently monitored for the following issues for this low-risk pregnancy and has Herpes genitalia; Latex allergy; Acne; Maternal morbid obesity, antepartum ([facility]); SVD (spontaneous vaginal delivery); Supervision of other normal pregnancy, antepartum; History of gestational hypertension; Short interval between pregnancies complicating pregnancy, antepartum; and GBS bacteriuria on their problem list.  Patient reports no complaints. Reports fetal movement. Denies any contractions, bleeding or leaking of fluid.   The following portions of the patient's history were reviewed and updated as appropriate: allergies, current medications, past family history, past medical history, past social history, past surgical history and problem list.   Objective:   General:  Alert, oriented and cooperative.   Mental Status: Normal mood and affect perceived. Normal judgment and thought content.  Rest of physical exam deferred due to type of encounter  Assessment and Plan:  Pregnancy: G2P1001 at [redacted]w[redacted]d 1. Supervision of other normal pregnancy, antepartum - Patient doing well, no complaints - Plans on keep baby at this time, might consider adoption later in pregnancy  - Routine prenatal care - Anticipatory guidance on upcoming appointments with next  visit in person to complete AFP  - [place] MFM OB DETAIL +14 WK; Future  2. Short interval between pregnancies complicating pregnancy, antepartum  3. History of gestational hypertension - BP stable today, 113/89 - continue bASA  - encouraged patient to take BP at home weekly and enter into babyscripts app  - Educated and discussed how to use BP cuff  - encouraged patient to bring BP cuff to the office at next visit  4. GBS bacteriuria - patient reports not picking up medication, Rx resent to the pharmacy for pickup  - encouraged patient to pick up medication today and take in completion, patient verbalizes understanding  - cefadroxil (DURICEF) 500 MG capsule; Take 1 capsule (500 mg total) by mouth 2 (two) times daily.  Dispense: 14 capsule; Refill: 0   Preterm labor symptoms and general obstetric precautions including but not limited to vaginal bleeding, contractions, leaking of fluid and fetal movement were reviewed in detail with the patient.  I discussed the assessment and treatment plan with the patient. The patient was provided an opportunity to ask questions and all were answered. The patient agreed with the plan and demonstrated an understanding of the instructions. The patient was advised to call back or seek an in-person office evaluation/go to [facility] at [facility] for any urgent or concerning symptoms. Please refer to After Visit Summary for other counseling recommendations.   I provided 11 minutes of non-face-to-face time during this encounter.  Return in about 4 weeks (around 01/11/2020) for ROB/AFP.  Future Appointments  Date Time Provider Department Center  01/04/2020 10:30 AM [facility] [place] 1 [facility]  01/11/2020  1:00 PM Gerrit Heck, CNM [facility] None    Sharyon Cable, CNM Center for [employer], [facility]

## 2020-01-04 ENCOUNTER — Other Ambulatory Visit (HOSPITAL_COMMUNITY): Payer: Self-pay | Admitting: *Deleted

## 2020-01-04 ENCOUNTER — Other Ambulatory Visit: Payer: Self-pay

## 2020-01-04 ENCOUNTER — Encounter (HOSPITAL_COMMUNITY): Payer: Self-pay

## 2020-01-04 ENCOUNTER — Ambulatory Visit (HOSPITAL_COMMUNITY): Payer: Medicaid Other | Admitting: *Deleted

## 2020-01-04 ENCOUNTER — Ambulatory Visit (HOSPITAL_COMMUNITY)
Admission: RE | Admit: 2020-01-04 | Discharge: 2020-01-04 | Disposition: A | Discharge status: Home / Self Care | Payer: Medicaid Other | Source: Ambulatory Visit | Attending: Obstetrics and Gynecology | Admitting: Obstetrics and Gynecology

## 2020-01-04 DIAGNOSIS — O99212 Obesity complicating pregnancy, second trimester: Secondary | ICD-10-CM | POA: Diagnosis not present

## 2020-01-04 DIAGNOSIS — E669 Obesity, unspecified: Secondary | ICD-10-CM

## 2020-01-04 DIAGNOSIS — O09899 Supervision of other high risk pregnancies, unspecified trimester: Secondary | ICD-10-CM

## 2020-01-04 DIAGNOSIS — Z3A19 19 weeks gestation of pregnancy: Secondary | ICD-10-CM | POA: Diagnosis not present

## 2020-01-04 DIAGNOSIS — Z3480 Encounter for supervision of other normal pregnancy, unspecified trimester: Secondary | ICD-10-CM

## 2020-01-04 DIAGNOSIS — Z8759 Personal history of other complications of pregnancy, childbirth and the puerperium: Secondary | ICD-10-CM | POA: Diagnosis not present

## 2020-01-04 DIAGNOSIS — O358XX0 Maternal care for other (suspected) fetal abnormality and damage, not applicable or unspecified: Secondary | ICD-10-CM

## 2020-01-04 DIAGNOSIS — O09292 Supervision of pregnancy with other poor reproductive or obstetric history, second trimester: Secondary | ICD-10-CM

## 2020-01-04 DIAGNOSIS — O09892 Supervision of other high risk pregnancies, second trimester: Secondary | ICD-10-CM | POA: Diagnosis not present

## 2020-01-04 DIAGNOSIS — Z363 Encounter for antenatal screening for malformations: Secondary | ICD-10-CM

## 2020-01-04 DIAGNOSIS — Z362 Encounter for other antenatal screening follow-up: Secondary | ICD-10-CM

## 2020-01-11 ENCOUNTER — Ambulatory Visit (INDEPENDENT_AMBULATORY_CARE_PROVIDER_SITE_OTHER): Payer: Medicaid Other

## 2020-01-11 ENCOUNTER — Other Ambulatory Visit: Payer: Self-pay

## 2020-01-11 VITALS — BP 123/74 | HR 88 | Wt 238.4 lb

## 2020-01-11 DIAGNOSIS — R8271 Bacteriuria: Secondary | ICD-10-CM

## 2020-01-11 DIAGNOSIS — Z3480 Encounter for supervision of other normal pregnancy, unspecified trimester: Secondary | ICD-10-CM | POA: Diagnosis not present

## 2020-01-11 DIAGNOSIS — Z3A20 20 weeks gestation of pregnancy: Secondary | ICD-10-CM

## 2020-01-11 DIAGNOSIS — O09292 Supervision of pregnancy with other poor reproductive or obstetric history, second trimester: Secondary | ICD-10-CM

## 2020-01-11 DIAGNOSIS — O98812 Other maternal infectious and parasitic diseases complicating pregnancy, second trimester: Secondary | ICD-10-CM

## 2020-01-11 DIAGNOSIS — Z8759 Personal history of other complications of pregnancy, childbirth and the puerperium: Secondary | ICD-10-CM

## 2020-01-11 NOTE — Progress Notes (Signed)
Patient states that she is not feeling fetal movement yet, reports some cramping.

## 2020-01-11 NOTE — Progress Notes (Signed)
   LOW-RISK PREGNANCY OFFICE VISIT  Patient name: Taylor Reed MRN [id number]  Date of birth: 1995/10/25 Chief Complaint:   Routine Prenatal Visit  Subjective:   MINIYA Reed is a 24 y.o. G2P1001 female at [redacted]w[redacted]d with an Estimated Date of Delivery: 05/30/20 being seen today for ongoing management of a low-risk pregnancy.  Today she reports no complaints. Contractions: Irritability. Vag. Bleeding: None.   . She denies vaginal discharge, bleeding, and leaking of fluid.  She also denies pain with urination or during sexual activity. Patient states that she is coping well with pregnancy and having an infant, but goes on to admit that she is sad more than usual.   Reviewed past medical,surgical, social, obstetrical and family history as well as problem list, medications and allergies.  Objective   Vitals:   01/11/20 1308  BP: 123/74  Pulse: 88  Weight: 238 lb 6.4 oz (108.1 kg)  Body mass index is 40.92 kg/m.        Physical Examination:   General appearance: Well appearing, and in no distress  Mental status: Alert, oriented to person, place, and time  Skin: Warm & dry  Cardiovascular: Normal heart rate noted  Respiratory: Normal respiratory effort, no distress  Abdomen: Soft, nontender, Fundus U/-2  Pelvic: Cervical exam deferred           Extremities: Edema: None  Fetal Status: Fetal Heart Rate (bpm): 138      No results found for this or any previous visit (from the past 24 hour(s)).  Assessment & Plan:  Low-risk pregnancy of a 24 y.o., G2P1001 at [redacted]w[redacted]d with an Estimated Date of Delivery: 05/30/20   1. Supervision of other normal pregnancy, antepartum -Anticipatory guidance for upcoming appts -Reviewed testing for today. -Informed that results will be released to mychart. -Offered and declines speaking with [facility] specialist today. -Encouraged to contact staff if feeling she is not coping well or feelings of depression arise. -Reassured that depression and feeling of being  overwhelmed normal with small children.   2. History of gestational hypertension -Not currently taking bASA. -Instructed to start daily dosing.   3. GBS bacteriuria -Completed medication.    Meds: No orders of the defined types were placed in this encounter.  Labs/procedures today:  Lab Orders  No laboratory test(s) ordered today     Reviewed: Preterm labor symptoms and general obstetric precautions including but not limited to vaginal bleeding, contractions, leaking of fluid and fetal movement were reviewed in detail with the patient.  All questions were answered.  Follow-up: Return in about 4 weeks (around 02/08/2020) for LR-ROB via VV.  No orders of the defined types were placed in this encounter.  Cherre Robins MSN, CNM 01/11/2020

## 2020-01-11 NOTE — Patient Instructions (Signed)
Contraception Choices Contraception, also called birth control, refers to methods or devices that prevent pregnancy. Hormonal methods Contraceptive implant  A contraceptive implant is a thin, plastic tube that contains a hormone. It is inserted into the upper part of the arm. It can remain in place for up to 3 years. Progestin-only injections Progestin-only injections are injections of progestin, a synthetic form of the hormone progesterone. They are given every 3 months by a health care provider. Birth control pills  Birth control pills are pills that contain hormones that prevent pregnancy. They must be taken once a day, preferably at the same time each day. Birth control patch  The birth control patch contains hormones that prevent pregnancy. It is placed on the skin and must be changed once a week for three weeks and removed on the fourth week. A prescription is needed to use this method of contraception. Vaginal ring  A vaginal ring contains hormones that prevent pregnancy. It is placed in the vagina for three weeks and removed on the fourth week. After that, the process is repeated with a new ring. A prescription is needed to use this method of contraception. Emergency contraceptive Emergency contraceptives prevent pregnancy after unprotected sex. They come in pill form and can be taken up to 5 days after sex. They work best the sooner they are taken after having sex. Most emergency contraceptives are available without a prescription. This method should not be used as your only form of birth control. Barrier methods Female condom  A female condom is a thin sheath that is worn over the penis during sex. Condoms keep sperm from going inside a woman's body. They can be used with a spermicide to increase their effectiveness. They should be disposed after a single use. Female condom  A female condom is a soft, loose-fitting sheath that is put into the vagina before sex. The condom keeps sperm  from going inside a woman's body. They should be disposed after a single use. Diaphragm  A diaphragm is a soft, dome-shaped barrier. It is inserted into the vagina before sex, along with a spermicide. The diaphragm blocks sperm from entering the uterus, and the spermicide kills sperm. A diaphragm should be left in the vagina for 6-8 hours after sex and removed within 24 hours. A diaphragm is prescribed and fitted by a health care provider. A diaphragm should be replaced every 1-2 years, after giving birth, after gaining more than 15 lb (6.8 kg), and after pelvic surgery. Cervical cap  A cervical cap is a round, soft latex or plastic cup that fits over the cervix. It is inserted into the vagina before sex, along with spermicide. It blocks sperm from entering the uterus. The cap should be left in place for 6-8 hours after sex and removed within 48 hours. A cervical cap must be prescribed and fitted by a health care provider. It should be replaced every 2 years. Sponge  A sponge is a soft, circular piece of polyurethane foam with spermicide on it. The sponge helps block sperm from entering the uterus, and the spermicide kills sperm. To use it, you make it wet and then insert it into the vagina. It should be inserted before sex, left in for at least 6 hours after sex, and removed and thrown away within 30 hours. Spermicides Spermicides are chemicals that kill or block sperm from entering the cervix and uterus. They can come as a cream, jelly, suppository, foam, or tablet. A spermicide should be inserted into the   vagina with an applicator at least 10-15 minutes before sex to allow time for it to work. The process must be repeated every time you have sex. Spermicides do not require a prescription. Intrauterine contraception Intrauterine device (IUD) An IUD is a T-shaped device that is put in a woman's uterus. There are two types:  Hormone IUD.This type contains progestin, a synthetic form of the hormone  progesterone. This type can stay in place for 3-5 years.  Copper IUD.This type is wrapped in copper wire. It can stay in place for 10 years.  Permanent methods of contraception Female tubal ligation In this method, a woman's fallopian tubes are sealed, tied, or blocked during surgery to prevent eggs from traveling to the uterus. Hysteroscopic sterilization In this method, a small, flexible insert is placed into each fallopian tube. The inserts cause scar tissue to form in the fallopian tubes and block them, so sperm cannot reach an egg. The procedure takes about 3 months to be effective. Another form of birth control must be used during those 3 months. Female sterilization This is a procedure to tie off the tubes that carry sperm (vasectomy). After the procedure, the man can still ejaculate fluid (semen). Natural planning methods Natural family planning In this method, a couple does not have sex on days when the woman could become pregnant. Calendar method This means keeping track of the length of each menstrual cycle, identifying the days when pregnancy can happen, and not having sex on those days. Ovulation method In this method, a couple avoids sex during ovulation. Symptothermal method This method involves not having sex during ovulation. The woman typically checks for ovulation by watching changes in her temperature and in the consistency of cervical mucus. Post-ovulation method In this method, a couple waits to have sex until after ovulation. Summary  Contraception, also called birth control, means methods or devices that prevent pregnancy.  Hormonal methods of contraception include implants, injections, pills, patches, vaginal rings, and emergency contraceptives.  Barrier methods of contraception can include female condoms, female condoms, diaphragms, cervical caps, sponges, and spermicides.  There are two types of IUDs (intrauterine devices). An IUD can be put in a woman's uterus to  prevent pregnancy for 3-5 years.  Permanent sterilization can be done through a procedure for males, females, or both.  Natural family planning methods involve not having sex on days when the woman could become pregnant. This information is not intended to replace advice given to you by your health care provider. Make sure you discuss any questions you have with your health care provider. Document Revised: 10/07/2017 Document Reviewed: 11/07/2016 Elsevier Patient Education  2020 [employer]  

## 2020-01-13 LAB — AFP, SERUM, OPEN SPINA BIFIDA
AFP MoM: 1.33
AFP Value: 64.5 ng/mL
Gest. Age on Collection Date: 20 weeks
Maternal Age At EDD: 24 yr
OSBR Risk 1 IN: 8798
Test Results:: NEGATIVE
Weight: 238 [lb_av]

## 2020-02-01 ENCOUNTER — Ambulatory Visit (HOSPITAL_COMMUNITY): Payer: Medicaid Other

## 2020-02-01 ENCOUNTER — Ambulatory Visit (HOSPITAL_COMMUNITY): Admission: RE | Admit: 2020-02-01 | Payer: Medicaid Other | Source: Ambulatory Visit

## 2020-02-03 ENCOUNTER — Encounter (HOSPITAL_COMMUNITY): Payer: Self-pay | Admitting: Obstetrics & Gynecology

## 2020-02-03 ENCOUNTER — Other Ambulatory Visit: Payer: Self-pay

## 2020-02-03 ENCOUNTER — Inpatient Hospital Stay (HOSPITAL_COMMUNITY)
Admission: AD | Admit: 2020-02-03 | Discharge: 2020-02-03 | Disposition: A | Discharge status: Home / Self Care | Payer: Medicaid Other | Attending: Obstetrics & Gynecology | Admitting: Obstetrics & Gynecology

## 2020-02-03 ENCOUNTER — Inpatient Hospital Stay (HOSPITAL_BASED_OUTPATIENT_CLINIC_OR_DEPARTMENT_OTHER): Payer: Medicaid Other

## 2020-02-03 DIAGNOSIS — Z7982 Long term (current) use of aspirin: Secondary | ICD-10-CM | POA: Diagnosis not present

## 2020-02-03 DIAGNOSIS — O139 Gestational [pregnancy-induced] hypertension without significant proteinuria, unspecified trimester: Secondary | ICD-10-CM | POA: Diagnosis not present

## 2020-02-03 DIAGNOSIS — Z3A23 23 weeks gestation of pregnancy: Secondary | ICD-10-CM | POA: Diagnosis not present

## 2020-02-03 DIAGNOSIS — O99332 Smoking (tobacco) complicating pregnancy, second trimester: Secondary | ICD-10-CM | POA: Insufficient documentation

## 2020-02-03 DIAGNOSIS — O09292 Supervision of pregnancy with other poor reproductive or obstetric history, second trimester: Secondary | ICD-10-CM

## 2020-02-03 DIAGNOSIS — O26899 Other specified pregnancy related conditions, unspecified trimester: Secondary | ICD-10-CM

## 2020-02-03 DIAGNOSIS — O26892 Other specified pregnancy related conditions, second trimester: Secondary | ICD-10-CM | POA: Insufficient documentation

## 2020-02-03 DIAGNOSIS — R102 Pelvic and perineal pain: Secondary | ICD-10-CM | POA: Diagnosis not present

## 2020-02-03 DIAGNOSIS — O99891 Other specified diseases and conditions complicating pregnancy: Secondary | ICD-10-CM

## 2020-02-03 DIAGNOSIS — E669 Obesity, unspecified: Secondary | ICD-10-CM

## 2020-02-03 DIAGNOSIS — Z3689 Encounter for other specified antenatal screening: Secondary | ICD-10-CM | POA: Diagnosis not present

## 2020-02-03 DIAGNOSIS — O4692 Antepartum hemorrhage, unspecified, second trimester: Secondary | ICD-10-CM | POA: Diagnosis not present

## 2020-02-03 DIAGNOSIS — O322XX0 Maternal care for transverse and oblique lie, not applicable or unspecified: Secondary | ICD-10-CM | POA: Diagnosis not present

## 2020-02-03 DIAGNOSIS — O358XX0 Maternal care for other (suspected) fetal abnormality and damage, not applicable or unspecified: Secondary | ICD-10-CM | POA: Insufficient documentation

## 2020-02-03 DIAGNOSIS — Z8759 Personal history of other complications of pregnancy, childbirth and the puerperium: Secondary | ICD-10-CM | POA: Insufficient documentation

## 2020-02-03 DIAGNOSIS — F17210 Nicotine dependence, cigarettes, uncomplicated: Secondary | ICD-10-CM | POA: Diagnosis not present

## 2020-02-03 DIAGNOSIS — B9689 Other specified bacterial agents as the cause of diseases classified elsewhere: Secondary | ICD-10-CM | POA: Insufficient documentation

## 2020-02-03 DIAGNOSIS — O23592 Infection of other part of genital tract in pregnancy, second trimester: Secondary | ICD-10-CM | POA: Insufficient documentation

## 2020-02-03 DIAGNOSIS — O4690 Antepartum hemorrhage, unspecified, unspecified trimester: Secondary | ICD-10-CM

## 2020-02-03 DIAGNOSIS — O99212 Obesity complicating pregnancy, second trimester: Secondary | ICD-10-CM | POA: Insufficient documentation

## 2020-02-03 DIAGNOSIS — O321XX0 Maternal care for breech presentation, not applicable or unspecified: Secondary | ICD-10-CM | POA: Insufficient documentation

## 2020-02-03 DIAGNOSIS — O09892 Supervision of other high risk pregnancies, second trimester: Secondary | ICD-10-CM

## 2020-02-03 DIAGNOSIS — R109 Unspecified abdominal pain: Secondary | ICD-10-CM | POA: Diagnosis not present

## 2020-02-03 DIAGNOSIS — N760 Acute vaginitis: Secondary | ICD-10-CM

## 2020-02-03 LAB — URINALYSIS, ROUTINE W REFLEX MICROSCOPIC
Bilirubin Urine: NEGATIVE
Glucose, UA: NEGATIVE mg/dL
Hgb urine dipstick: NEGATIVE
Ketones, ur: NEGATIVE mg/dL
Nitrite: NEGATIVE
Protein, ur: 30 mg/dL — AB
Specific Gravity, Urine: 1.03 (ref 1.005–1.030)
pH: 5 (ref 5.0–8.0)

## 2020-02-03 LAB — WET PREP, GENITAL
Sperm: NONE SEEN
Trich, Wet Prep: NONE SEEN
Yeast Wet Prep HPF POC: NONE SEEN

## 2020-02-03 MED ORDER — CYCLOBENZAPRINE HCL 10 MG PO TABS: 10.0000 mg | ORAL_TABLET | Freq: Three times a day (TID) | ORAL | 0 refills | Status: DC | PRN

## 2020-02-03 MED ORDER — ACETAMINOPHEN 500 MG PO TABS
1000.0000 mg | ORAL_TABLET | Freq: Once | ORAL | Status: AC
  Administered 2020-02-03: 11:00:00 1000 mg via ORAL
  Filled 2020-02-03: qty 2

## 2020-02-03 MED ORDER — METRONIDAZOLE 500 MG PO TABS: 500.0000 mg | ORAL_TABLET | Freq: Two times a day (BID) | ORAL | 0 refills | Status: AC

## 2020-02-03 MED ORDER — CYCLOBENZAPRINE HCL 5 MG PO TABS
10.0000 mg | ORAL_TABLET | Freq: Once | ORAL | Status: AC
  Administered 2020-02-03: 10 mg via ORAL
  Filled 2020-02-03: qty 2

## 2020-02-03 NOTE — MAU Provider Note (Signed)
History     CSN: [id number]  Arrival date and time: 02/03/20 4287   First Provider Initiated Contact with Patient 02/03/20 1105      Chief Complaint  Patient presents with  . Abdominal Pain   Taylor Reed is a 24 y.o. G2P1001 at [redacted]w[redacted]d who presents to MAU for PTL evaluation after she started experiencing pain in her mid-line pelvic area and left-hand side that began a couple weeks ago and was intermittent, but this morning the pain came and has been constant since then. Pt rates pain as 5/10 and has not tried any treatment for it. Patient reports she does have a pregnancy support belt at home.  Pt reports spotting last night, but she did not have sex. Patient denies any bleeding today.  Pt denies ctx, change in vaginal discharge amount/color/consistency, VB, new onset backache, intermittent abdominal discomfort/pain, pelvic pressure/pain, cramping. Pt denies chest pain and SOB.  Pt denies constipation, diarrhea, or urinary problems. Pt denies fever, chills, fatigue, sweating or changes in appetite. Pt denies dizziness, light-headedness, weakness.  Pt denies VB, LOF and reports good FM.  Current pregnancy problems? Hx gHTN Blood Type? O Positive Allergies? Latex, seasonal Current medications? PNV, bASA Current PNC & next appt? [facility], 02/07/2020   OB History    Gravida  2   Para  1   Term  1   Preterm      AB      Living  1     SAB      TAB      Ectopic      Multiple  0   Live Births  1           Past Medical History:  Diagnosis Date  . Depression   . DUB (dysfunctional uterine bleeding)   . Encounter for supervision of normal first pregnancy in third trimester 12/01/2018    Nursing Staff Provider Office Location  [facility] Dating  LMP c/w 106 w sono Language  English  Anatomy US   incomplete- f/u scheduled-Nml Flu Vaccine   declined 12/01/18 Genetic Screen  NIPS: low risks  AFP:  neg  TDaP vaccine   declined 03/01/19 Hgb A1C or  GTT Early  Third  trimester: Nml Rhogam  NA   LAB RESULTS  Feeding Plan  Both Blood Type O/Positive/-- (02/13 1530)  Contraception  Depo Antibody   . GC (gonococcus infection) 07/09/2014  . Gestational hypertension 04/05/2019   Diagnosed 6/17  . Gestational hypertension, third trimester 05/03/2019  . Seasonal allergies   . UTI (urinary tract infection) 07/09/2014    Past Surgical History:  Procedure Laterality Date  . NO PAST SURGERIES      Family History  Problem Relation Age of Onset  . Cancer Mother     Social History   Tobacco Use  . Smoking status: Current Every Day Smoker    Types: Cigarettes  . Smokeless tobacco: Never Used  . Tobacco comment: none since dec 2019  Substance Use Topics  . Alcohol use: Not Currently  . Drug use: Not Currently    Allergies:  Allergies  Allergen Reactions  . Latex Rash    Medications Prior to Admission  Medication Sig Dispense Refill Last Dose  . aspirin EC 81 MG tablet Take 1 tablet (81 mg total) by mouth daily. Take after 12 weeks for prevention of preeclampsia later in pregnancy (Patient not taking: Reported on 01/04/2020) 300 tablet 0   . cefadroxil (DURICEF) 500 MG capsule Take 1  capsule (500 mg total) by mouth 2 (two) times daily. (Patient not taking: Reported on 01/04/2020) 14 capsule 0   . Prenatal MV-Min-FA-Omega-3 (PRENATAL GUMMIES/DHA & FA) 0.4-32.5 MG CHEW Chew 3 tablets by mouth daily. 90 tablet 3   . Prenatal Vit-Fe Fumarate-FA (PREPLUS) 27-1 MG TABS Take 1 tablet by mouth daily. (Patient not taking: Reported on 01/04/2020) 30 tablet 13     Review of Systems  Constitutional: Negative for chills, diaphoresis, fatigue and fever.  Eyes: Negative for visual disturbance.  Respiratory: Negative for shortness of breath.   Cardiovascular: Negative for chest pain.  Gastrointestinal: Positive for abdominal pain. Negative for constipation, diarrhea, nausea and vomiting.  Genitourinary: Positive for pelvic pain. Negative for dysuria, flank pain,  frequency, urgency, vaginal bleeding and vaginal discharge.  Neurological: Negative for dizziness, weakness, light-headedness and headaches.   Physical Exam   Blood pressure (!) 112/59, pulse (!) 107, temperature 97.9 F (36.6 C), temperature source Oral, resp. rate 19, last menstrual period 08/24/2019, SpO2 99 %, unknown if currently breastfeeding.  Patient Vitals for the past 24 hrs:  BP Temp Temp src Pulse Resp SpO2  02/03/20 0951 (!) 112/59 97.9 F (36.6 C) Oral (!) 107 19 99 %   Physical Exam  Constitutional: She is oriented to person, place, and time. She appears well-developed and well-nourished. No distress.  HENT:  Head: Normocephalic and atraumatic.  Respiratory: Effort normal.  GI: Soft. She exhibits no distension and no mass. There is no abdominal tenderness. There is no rebound, no guarding and no CVA tenderness.  Genitourinary: There is no rash, tenderness or lesion on the right labia. There is no rash, tenderness or lesion on the left labia. Cervix exhibits no motion tenderness, no discharge and no friability.    Vaginal discharge (white, frothy discharge without odor) present.     No vaginal tenderness or bleeding.  No tenderness or bleeding in the vagina.    Genitourinary Comments: Cervix long/closed/posterior   Neurological: She is alert and oriented to person, place, and time.  Skin: Skin is warm and dry. She is not diaphoretic.  Psychiatric: She has a normal mood and affect. Her behavior is normal. Judgment and thought content normal.   Results for orders placed or performed during the hospital encounter of 02/03/20 (from the past 24 hour(s))  Urinalysis, Routine w reflex microscopic     Status: Abnormal   Collection Time: 02/03/20 10:12 AM  Result Value Ref Range   Color, Urine YELLOW YELLOW   APPearance CLOUDY (A) CLEAR   Specific Gravity, Urine 1.030 1.005 - 1.030   pH 5.0 5.0 - 8.0   Glucose, UA NEGATIVE NEGATIVE mg/dL   Hgb urine dipstick NEGATIVE NEGATIVE    Bilirubin Urine NEGATIVE NEGATIVE   Ketones, ur NEGATIVE NEGATIVE mg/dL   Protein, ur 30 (A) NEGATIVE mg/dL   Nitrite NEGATIVE NEGATIVE   Leukocytes,Ua MODERATE (A) NEGATIVE   RBC / HPF 0-5 0 - 5 RBC/hpf   WBC, UA 21-50 0 - 5 WBC/hpf   Bacteria, UA FEW (A) NONE SEEN   Squamous Epithelial / LPF 21-50 0 - 5   Mucus PRESENT   Wet prep, genital     Status: Abnormal   Collection Time: 02/03/20  1:45 PM  Result Value Ref Range   Yeast Wet Prep HPF POC NONE SEEN NONE SEEN   Trich, Wet Prep NONE SEEN NONE SEEN   Clue Cells Wet Prep HPF POC PRESENT (A) NONE SEEN   WBC, Wet Prep HPF POC MANY (A)  NONE SEEN   Sperm NONE SEEN     MAU Course  Procedures  MDM -suspect RLP, r/o PTL -UA: cloudy/30PRO/mod leuks/few bacteria, sending urine for culture based on symptoms -[place]: CL 4.48cm, AFI WNL, anterior placenta, no abruption or previa identified -CE: long/internal os closed/posterior -fFN: outside of [place] limits -no vaginal bleeding on exam, +frothy, white discharge -WetPrep: +ClueCells (will treat for BV based on symptoms) -GC/CT collected -EFM: reassuring       -baseline: 140       -variability: moderate       -accels: present, less than 10x10       -decels: absent       -TOCO: no ctx -Flexeril 10mg  and Tylenol 1000mg  given for pain, pt reports pain now 3/10 and improving -pt discharged to home in stable condition  Orders Placed This Encounter  Procedures  . Wet prep, genital    Standing Status:   Standing    Number of Occurrences:   1  . Culture, OB Urine    Standing Status:   Standing    Number of Occurrences:   1  . MFM OB LIMITED    Please perform cervical length. May order TVUS as necessary.    Standing Status:   Standing    Number of Occurrences:   1    Order Specific Question:   Symptom/Reason for Exam    Answer:   Vaginal bleeding in pregnancy [705036]  . Urinalysis, Routine w reflex microscopic    Standing Status:   Standing    Number of Occurrences:   1  .  Discharge patient    Order Specific Question:   Discharge disposition    Answer:   01-Home or Self Care [1]    Order Specific Question:   Discharge patient date    Answer:   02/03/2020   Meds ordered this encounter  Medications  . cyclobenzaprine (FLEXERIL) tablet 10 mg  . acetaminophen (TYLENOL) tablet 1,000 mg  . metroNIDAZOLE (FLAGYL) 500 MG tablet    Sig: Take 1 tablet (500 mg total) by mouth 2 (two) times daily for 7 days.    Dispense:  14 tablet    Refill:  0    Order Specific Question:   Supervising Provider    Answer:   [place] [[id number]]  . cyclobenzaprine (FLEXERIL) 10 MG tablet    Sig: Take 1 tablet (10 mg total) by mouth 3 (three) times daily as needed.    Dispense:  30 tablet    Refill:  0    Order Specific Question:   Supervising Provider    Answer:   02/05/2020 [[id number]]    Assessment and Plan   1. Bacterial vaginosis   2. Vaginal bleeding in pregnancy   3. Pelvic pain in pregnancy   4. Abdominal pain during pregnancy in second trimester   5. [redacted] weeks gestation of pregnancy   6. NST (non-stress test) reactive    Allergies as of 02/03/2020      Reactions   Latex Rash      Medication List    TAKE these medications   aspirin EC 81 MG tablet Take 1 tablet (81 mg total) by mouth daily. Take after 12 weeks for prevention of preeclampsia later in pregnancy   cefadroxil 500 MG capsule Commonly known as: DURICEF Take 1 capsule (500 mg total) by mouth 2 (two) times daily.   cyclobenzaprine 10 MG tablet Commonly known as: FLEXERIL Take 1 tablet (10 mg total)  by mouth 3 (three) times daily as needed.   metroNIDAZOLE 500 MG tablet Commonly known as: Flagyl Take 1 tablet (500 mg total) by mouth 2 (two) times daily for 7 days.   Prenatal Gummies/DHA & FA 0.4-32.5 MG Chew Chew 3 tablets by mouth daily.   PrePLUS 27-1 MG Tabs Take 1 tablet by mouth daily.      -will call with culture results, if positive -discussed musculoskeletal vs. PTL  s/sx -discussed ice/heat/meds for relief of musculoskeletal discomfort -RX Flexeril -RX Metronidazole -return MAU precautions given -pt discharged to home in stable condition  Joni Reining E Youa Deloney 02/03/2020, 3:28 PM

## 2020-02-03 NOTE — MAU Note (Signed)
VOLANDA MANGINE is a 24 y.o. at [redacted]w[redacted]d here in MAU reporting:  +lower left abdominal pain that radiates to lower back Sharp and constant  Onset of complaint: upon awakening this morning Pain score: 4-5/10  Endorses vaginal bleeding last night. Brown in color and spotting in nature. None today Vitals:   02/03/20 0951  BP: (!) 112/59  Pulse: (!) 107  Resp: 19  Temp: 97.9 F (36.6 C)  SpO2: 99%     +FM Lab orders placed from triage: ua

## 2020-02-03 NOTE — Discharge Instructions (Signed)
Abdominal Pain During Pregnancy  Abdominal pain is common during pregnancy, and has many possible causes. Some causes are more serious than others, and sometimes the cause is not known. Abdominal pain can be a sign that labor is starting. It can also be caused by normal growth and stretching of muscles and ligaments during pregnancy. Always tell your health care provider if you have any abdominal pain. Follow these instructions at home:  Do not have sex or put anything in your vagina until your pain goes away completely.  Get plenty of rest until your pain improves.  Drink enough fluid to keep your urine pale yellow.  Take over-the-counter and prescription medicines only as told by your health care provider.  Keep all follow-up visits as told by your health care provider. This is important. Contact a health care provider if:  Your pain continues or gets worse after resting.  You have lower abdominal pain that: ? Comes and goes at regular intervals. ? Spreads to your back. ? Is similar to menstrual cramps.  You have pain or burning when you urinate. Get help right away if:  You have a fever or chills.  You have vaginal bleeding.  You are leaking fluid from your vagina.  You are passing tissue from your vagina.  You have vomiting or diarrhea that lasts for more than 24 hours.  Your baby is moving less than usual.  You feel very weak or faint.  You have shortness of breath.  You develop severe pain in your upper abdomen. Summary  Abdominal pain is common during pregnancy, and has many possible causes.  If you experience abdominal pain during pregnancy, tell your health care provider right away.  Follow your health care provider's home care instructions and keep all follow-up visits as directed. This information is not intended to replace advice given to you by your health care provider. Make sure you discuss any questions you have with your health care  provider. Document Revised: 01/23/2019 Document Reviewed: 01/07/2017 Elsevier Patient Education  2020 [employer].      Third Trimester of Pregnancy The third trimester is from week 28 through week 40 (months 7 through 9). The third trimester is a time when the unborn baby (fetus) is growing rapidly. At the end of the ninth month, the fetus is about 20 inches in length and weighs 6-10 pounds. Body changes during your third trimester Your body will continue to go through many changes during pregnancy. The changes vary from woman to woman. During the third trimester:  Your weight will continue to increase. You can expect to gain 25-35 pounds (11-16 kg) by the end of the pregnancy.  You may begin to get stretch marks on your hips, abdomen, and breasts.  You may urinate more often because the fetus is moving lower into your pelvis and pressing on your bladder.  You may develop or continue to have heartburn. This is caused by increased hormones that slow down muscles in the digestive tract.  You may develop or continue to have constipation because increased hormones slow digestion and cause the muscles that push waste through your intestines to relax.  You may develop hemorrhoids. These are swollen veins (varicose veins) in the rectum that can itch or be painful.  You may develop swollen, bulging veins (varicose veins) in your legs.  You may have increased body aches in the pelvis, back, or thighs. This is due to weight gain and increased hormones that are relaxing your joints.  You  may have changes in your hair. These can include thickening of your hair, rapid growth, and changes in texture. Some women also have hair loss during or after pregnancy, or hair that feels dry or thin. Your hair will most likely return to normal after your baby is born.  Your breasts will continue to grow and they will continue to become tender. A yellow fluid (colostrum) may leak from your breasts. This is  the first milk you are producing for your baby.  Your belly button may stick out.  You may notice more swelling in your hands, face, or ankles.  You may have increased tingling or numbness in your hands, arms, and legs. The skin on your belly may also feel numb.  You may feel short of breath because of your expanding uterus.  You may have more problems sleeping. This can be caused by the size of your belly, increased need to urinate, and an increase in your body's metabolism.  You may notice the fetus "dropping," or moving lower in your abdomen (lightening).  You may have increased vaginal discharge.  You may notice your joints feel loose and you may have pain around your pelvic bone. What to expect at prenatal visits You will have prenatal exams every 2 weeks until week 36. Then you will have weekly prenatal exams. During a routine prenatal visit:  You will be weighed to make sure you and the baby are growing normally.  Your blood pressure will be taken.  Your abdomen will be measured to track your baby's growth.  The fetal heartbeat will be listened to.  Any test results from the previous visit will be discussed.  You may have a cervical check near your due date to see if your cervix has softened or thinned (effaced).  You will be tested for Group B streptococcus. This happens between 35 and 37 weeks. Your health care provider may ask you:  What your birth plan is.  How you are feeling.  If you are feeling the baby move.  If you have had any abnormal symptoms, such as leaking fluid, bleeding, severe headaches, or abdominal cramping.  If you are using any tobacco products, including cigarettes, chewing tobacco, and electronic cigarettes.  If you have any questions. Other tests or screenings that may be performed during your third trimester include:  Blood tests that check for low iron levels (anemia).  Fetal testing to check the health, activity level, and growth of  the fetus. Testing is done if you have certain medical conditions or if there are problems during the pregnancy.  Nonstress test (NST). This test checks the health of your baby to make sure there are no signs of problems, such as the baby not getting enough oxygen. During this test, a belt is placed around your belly. The baby is made to move, and its heart rate is monitored during movement. What is false labor? False labor is a condition in which you feel small, irregular tightenings of the muscles in the womb (contractions) that usually go away with rest, changing position, or drinking water. These are called Braxton Hicks contractions. Contractions may last for hours, days, or even weeks before true labor sets in. If contractions come at regular intervals, become more frequent, increase in intensity, or become painful, you should see your health care provider. What are the signs of labor?  Abdominal cramps.  Regular contractions that start at 10 minutes apart and become stronger and more frequent with time.  Contractions that  start on the top of the uterus and spread down to the lower abdomen and back.  Increased pelvic pressure and dull back pain.  A watery or bloody mucus discharge that comes from the vagina.  Leaking of amniotic fluid. This is also known as your "water breaking." It could be a slow trickle or a gush. Let your health care provider know if it has a color or strange odor. If you have any of these signs, call your health care provider right away, even if it is before your due date. Follow these instructions at home: Medicines  Follow your health care provider's instructions regarding medicine use. Specific medicines may be either safe or unsafe to take during pregnancy.  Take a prenatal vitamin that contains at least 600 micrograms (mcg) of folic acid.  If you develop constipation, try taking a stool softener if your health care provider approves. Eating and  drinking   Eat a balanced diet that includes fresh fruits and vegetables, whole grains, good sources of protein such as meat, eggs, or tofu, and low-fat dairy. Your health care provider will help you determine the amount of weight gain that is right for you.  Avoid raw meat and uncooked cheese. These carry germs that can cause birth defects in the baby.  If you have low calcium intake from food, talk to your health care provider about whether you should take a daily calcium supplement.  Eat four or five small meals rather than three large meals a day.  Limit foods that are high in fat and processed sugars, such as fried and sweet foods.  To prevent constipation: ? Drink enough fluid to keep your urine clear or pale yellow. ? Eat foods that are high in fiber, such as fresh fruits and vegetables, whole grains, and beans. Activity  Exercise only as directed by your health care provider. Most women can continue their usual exercise routine during pregnancy. Try to exercise for 30 minutes at least 5 days a week. Stop exercising if you experience uterine contractions.  Avoid heavy lifting.  Do not exercise in extreme heat or humidity, or at high altitudes.  Wear low-heel, comfortable shoes.  Practice good posture.  You may continue to have sex unless your health care provider tells you otherwise. Relieving pain and discomfort  Take frequent breaks and rest with your legs elevated if you have leg cramps or low back pain.  Take warm sitz baths to soothe any pain or discomfort caused by hemorrhoids. Use hemorrhoid cream if your health care provider approves.  Wear a good support bra to prevent discomfort from breast tenderness.  If you develop varicose veins: ? Wear support pantyhose or compression stockings as told by your healthcare provider. ? Elevate your feet for 15 minutes, 3-4 times a day. Prenatal care  Write down your questions. Take them to your prenatal visits.  Keep all  your prenatal visits as told by your health care provider. This is important. Safety  Wear your seat belt at all times when driving.  Make a list of emergency phone numbers, including numbers for family, friends, the hospital, and police and fire departments. General instructions  Avoid cat litter boxes and soil used by cats. These carry germs that can cause birth defects in the baby. If you have a cat, ask someone to clean the litter box for you.  Do not travel far distances unless it is absolutely necessary and only with the approval of your health care provider.  Do not  use hot tubs, steam rooms, or saunas.  Do not drink alcohol.  Do not use any products that contain nicotine or tobacco, such as cigarettes and e-cigarettes. If you need help quitting, ask your health care provider.  Do not use any medicinal herbs or unprescribed drugs. These chemicals affect the formation and growth of the baby.  Do not douche or use tampons or scented sanitary pads.  Do not cross your legs for long periods of time.  To prepare for the arrival of your baby: ? Take prenatal classes to understand, practice, and ask questions about labor and delivery. ? Make a trial run to the hospital. ? Visit the hospital and tour the maternity area. ? Arrange for maternity or paternity leave through employers. ? Arrange for family and friends to take care of pets while you are in the hospital. ? Purchase a rear-facing car seat and make sure you know how to install it in your car. ? Pack your hospital bag. ? Prepare the baby's nursery. Make sure to remove all pillows and stuffed animals from the baby's crib to prevent suffocation.  Visit your dentist if you have not gone during your pregnancy. Use a soft toothbrush to brush your teeth and be gentle when you floss. Contact a health care provider if:  You are unsure if you are in labor or if your water has broken.  You become dizzy.  You have mild pelvic  cramps, pelvic pressure, or nagging pain in your abdominal area.  You have lower back pain.  You have persistent nausea, vomiting, or diarrhea.  You have an unusual or bad smelling vaginal discharge.  You have pain when you urinate. Get help right away if:  Your water breaks before 37 weeks.  You have regular contractions less than 5 minutes apart before 37 weeks.  You have a fever.  You are leaking fluid from your vagina.  You have spotting or bleeding from your vagina.  You have severe abdominal pain or cramping.  You have rapid weight loss or weight gain.  You have shortness of breath with chest pain.  You notice sudden or extreme swelling of your face, hands, ankles, feet, or legs.  Your baby makes fewer than 10 movements in 2 hours.  You have severe headaches that do not go away when you take medicine.  You have vision changes. Summary  The third trimester is from week 28 through week 40, months 7 through 9. The third trimester is a time when the unborn baby (fetus) is growing rapidly.  During the third trimester, your discomfort may increase as you and your baby continue to gain weight. You may have abdominal, leg, and back pain, sleeping problems, and an increased need to urinate.  During the third trimester your breasts will keep growing and they will continue to become tender. A yellow fluid (colostrum) may leak from your breasts. This is the first milk you are producing for your baby.  False labor is a condition in which you feel small, irregular tightenings of the muscles in the womb (contractions) that eventually go away. These are called Braxton Hicks contractions. Contractions may last for hours, days, or even weeks before true labor sets in.  Signs of labor can include: abdominal cramps; regular contractions that start at 10 minutes apart and become stronger and more frequent with time; watery or bloody mucus discharge that comes from the vagina; increased  pelvic pressure and dull back pain; and leaking of amniotic fluid. This information  is not intended to replace advice given to you by your health care provider. Make sure you discuss any questions you have with your health care provider. Document Revised: 01/26/2019 Document Reviewed: 11/10/2016 Elsevier Patient Education  2020 [employer].      Second Trimester of Pregnancy The second trimester is from week 14 through week 27 (months 4 through 6). The second trimester is often a time when you feel your best. Your body has adjusted to being pregnant, and you begin to feel better physically. Usually, morning sickness has lessened or quit completely, you may have more energy, and you may have an increase in appetite. The second trimester is also a time when the fetus is growing rapidly. At the end of the sixth month, the fetus is about 9 inches long and weighs about 1 pounds. You will likely begin to feel the baby move (quickening) between 16 and 20 weeks of pregnancy. Body changes during your second trimester Your body continues to go through many changes during your second trimester. The changes vary from woman to woman.  Your weight will continue to increase. You will notice your lower abdomen bulging out.  You may begin to get stretch marks on your hips, abdomen, and breasts.  You may develop headaches that can be relieved by medicines. The medicines should be approved by your health care provider.  You may urinate more often because the fetus is pressing on your bladder.  You may develop or continue to have heartburn as a result of your pregnancy.  You may develop constipation because certain hormones are causing the muscles that push waste through your intestines to slow down.  You may develop hemorrhoids or swollen, bulging veins (varicose veins).  You may have back pain. This is caused by: ? Weight gain. ? Pregnancy hormones that are relaxing the joints in your pelvis. ? A  shift in weight and the muscles that support your balance.  Your breasts will continue to grow and they will continue to become tender.  Your gums may bleed and may be sensitive to brushing and flossing.  Dark spots or blotches (chloasma, mask of pregnancy) may develop on your face. This will likely fade after the baby is born.  A dark line from your belly button to the pubic area (linea nigra) may appear. This will likely fade after the baby is born.  You may have changes in your hair. These can include thickening of your hair, rapid growth, and changes in texture. Some women also have hair loss during or after pregnancy, or hair that feels dry or thin. Your hair will most likely return to normal after your baby is born. What to expect at prenatal visits During a routine prenatal visit:  You will be weighed to make sure you and the fetus are growing normally.  Your blood pressure will be taken.  Your abdomen will be measured to track your baby's growth.  The fetal heartbeat will be listened to.  Any test results from the previous visit will be discussed. Your health care provider may ask you:  How you are feeling.  If you are feeling the baby move.  If you have had any abnormal symptoms, such as leaking fluid, bleeding, severe headaches, or abdominal cramping.  If you are using any tobacco products, including cigarettes, chewing tobacco, and electronic cigarettes.  If you have any questions. Other tests that may be performed during your second trimester include:  Blood tests that check for: ? Low iron  levels (anemia). ? High blood sugar that affects pregnant women (gestational diabetes) between 4 and 28 weeks. ? Rh antibodies. This is to check for a protein on red blood cells (Rh factor).  Urine tests to check for infections, diabetes, or protein in the urine.  An ultrasound to confirm the proper growth and development of the baby.  An amniocentesis to check for  possible genetic problems.  Fetal screens for spina bifida and Down syndrome.  HIV (human immunodeficiency virus) testing. Routine prenatal testing includes screening for HIV, unless you choose not to have this test. Follow these instructions at home: Medicines  Follow your health care provider's instructions regarding medicine use. Specific medicines may be either safe or unsafe to take during pregnancy.  Take a prenatal vitamin that contains at least 600 micrograms (mcg) of folic acid.  If you develop constipation, try taking a stool softener if your health care provider approves. Eating and drinking   Eat a balanced diet that includes fresh fruits and vegetables, whole grains, good sources of protein such as meat, eggs, or tofu, and low-fat dairy. Your health care provider will help you determine the amount of weight gain that is right for you.  Avoid raw meat and uncooked cheese. These carry germs that can cause birth defects in the baby.  If you have low calcium intake from food, talk to your health care provider about whether you should take a daily calcium supplement.  Limit foods that are high in fat and processed sugars, such as fried and sweet foods.  To prevent constipation: ? Drink enough fluid to keep your urine clear or pale yellow. ? Eat foods that are high in fiber, such as fresh fruits and vegetables, whole grains, and beans. Activity  Exercise only as directed by your health care provider. Most women can continue their usual exercise routine during pregnancy. Try to exercise for 30 minutes at least 5 days a week. Stop exercising if you experience uterine contractions.  Avoid heavy lifting, wear low heel shoes, and practice good posture.  A sexual relationship may be continued unless your health care provider directs you otherwise. Relieving pain and discomfort  Wear a good support bra to prevent discomfort from breast tenderness.  Take warm sitz baths to soothe  any pain or discomfort caused by hemorrhoids. Use hemorrhoid cream if your health care provider approves.  Rest with your legs elevated if you have leg cramps or low back pain.  If you develop varicose veins, wear support hose. Elevate your feet for 15 minutes, 3-4 times a day. Limit salt in your diet. Prenatal Care  Write down your questions. Take them to your prenatal visits.  Keep all your prenatal visits as told by your health care provider. This is important. Safety  Wear your seat belt at all times when driving.  Make a list of emergency phone numbers, including numbers for family, friends, the hospital, and police and fire departments. General instructions  Ask your health care provider for a referral to a local prenatal education class. Begin classes no later than the beginning of month 6 of your pregnancy.  Ask for help if you have counseling or nutritional needs during pregnancy. Your health care provider can offer advice or refer you to specialists for help with various needs.  Do not use hot tubs, steam rooms, or saunas.  Do not douche or use tampons or scented sanitary pads.  Do not cross your legs for long periods of time.  Avoid cat  litter boxes and soil used by cats. These carry germs that can cause birth defects in the baby and possibly loss of the fetus by miscarriage or stillbirth.  Avoid all smoking, herbs, alcohol, and unprescribed drugs. Chemicals in these products can affect the formation and growth of the baby.  Do not use any products that contain nicotine or tobacco, such as cigarettes and e-cigarettes. If you need help quitting, ask your health care provider.  Visit your dentist if you have not gone yet during your pregnancy. Use a soft toothbrush to brush your teeth and be gentle when you floss. Contact a health care provider if:  You have dizziness.  You have mild pelvic cramps, pelvic pressure, or nagging pain in the abdominal area.  You have  persistent nausea, vomiting, or diarrhea.  You have a bad smelling vaginal discharge.  You have pain when you urinate. Get help right away if:  You have a fever.  You are leaking fluid from your vagina.  You have spotting or bleeding from your vagina.  You have severe abdominal cramping or pain.  You have rapid weight gain or weight loss.  You have shortness of breath with chest pain.  You notice sudden or extreme swelling of your face, hands, ankles, feet, or legs.  You have not felt your baby move in over an hour.  You have severe headaches that do not go away when you take medicine.  You have vision changes. Summary  The second trimester is from week 14 through week 27 (months 4 through 6). It is also a time when the fetus is growing rapidly.  Your body goes through many changes during pregnancy. The changes vary from woman to woman.  Avoid all smoking, herbs, alcohol, and unprescribed drugs. These chemicals affect the formation and growth your baby.  Do not use any tobacco products, such as cigarettes, chewing tobacco, and e-cigarettes. If you need help quitting, ask your health care provider.  Contact your health care provider if you have any questions. Keep all prenatal visits as told by your health care provider. This is important. This information is not intended to replace advice given to you by your health care provider. Make sure you discuss any questions you have with your health care provider. Document Revised: 01/27/2019 Document Reviewed: 11/10/2016 Elsevier Patient Education  [place].       Bacterial Vaginosis  Bacterial vaginosis is a vaginal infection that occurs when the normal balance of bacteria in the vagina is disrupted. It results from an overgrowth of certain bacteria. This is the most common vaginal infection among women ages 55-44. Because bacterial vaginosis increases your risk for STIs (sexually transmitted infections), getting  treated can help reduce your risk for chlamydia, gonorrhea, herpes, and HIV (human immunodeficiency virus). Treatment is also important for preventing complications in pregnant women, because this condition can cause an early (premature) delivery. What are the causes? This condition is caused by an increase in harmful bacteria that are normally present in small amounts in the vagina. However, the reason that the condition develops is not fully understood. What increases the risk? The following factors may make you more likely to develop this condition:  Having a new sexual partner or multiple sexual partners.  Having unprotected sex.  Douching.  Having an intrauterine device (IUD).  Smoking.  Drug and alcohol abuse.  Taking certain antibiotic medicines.  Being pregnant. You cannot get bacterial vaginosis from toilet seats, bedding, swimming pools, or contact with objects  around you. What are the signs or symptoms? Symptoms of this condition include:  Grey or white vaginal discharge. The discharge can also be watery or foamy.  A fish-like odor with discharge, especially after sexual intercourse or during menstruation.  Itching in and around the vagina.  Burning or pain with urination. Some women with bacterial vaginosis have no signs or symptoms. How is this diagnosed? This condition is diagnosed based on:  Your medical history.  A physical exam of the vagina.  Testing a sample of vaginal fluid under a microscope to look for a large amount of bad bacteria or abnormal cells. Your health care provider may use a cotton swab or a small wooden spatula to collect the sample. How is this treated? This condition is treated with antibiotics. These may be given as a pill, a vaginal cream, or a medicine that is put into the vagina (suppository). If the condition comes back after treatment, a second round of antibiotics may be needed. Follow these instructions at  home: Medicines  Take over-the-counter and prescription medicines only as told by your health care provider.  Take or use your antibiotic as told by your health care provider. Do not stop taking or using the antibiotic even if you start to feel better. General instructions  If you have a female sexual partner, tell her that you have a vaginal infection. She should see her health care provider and be treated if she has symptoms. If you have a female sexual partner, he does not need treatment.  During treatment: ? Avoid sexual activity until you finish treatment. ? Do not douche. ? Avoid alcohol as directed by your health care provider. ? Avoid breastfeeding as directed by your health care provider.  Drink enough water and fluids to keep your urine clear or pale yellow.  Keep the area around your vagina and rectum clean. ? Wash the area daily with warm water. ? Wipe yourself from front to back after using the toilet.  Keep all follow-up visits as told by your health care provider. This is important. How is this prevented?  Do not douche.  Wash the outside of your vagina with warm water only.  Use protection when having sex. This includes latex condoms and dental dams.  Limit how many sexual partners you have. To help prevent bacterial vaginosis, it is best to have sex with just one partner (monogamous).  Make sure you and your sexual partner are tested for STIs.  Wear cotton or cotton-lined underwear.  Avoid wearing tight pants and pantyhose, especially during summer.  Limit the amount of alcohol that you drink.  Do not use any products that contain nicotine or tobacco, such as cigarettes and e-cigarettes. If you need help quitting, ask your health care provider.  Do not use illegal drugs. Where to find more information  Centers for Disease Control and Prevention: SolutionApps.co.za  American Sexual Health Association (ASHA): www.ashastd.org  [place] Department of Health and  Health and safety inspector, Office on Women's Health: ConventionalMedicines.si or http://www.anderson-williamson.info/ Contact a health care provider if:  Your symptoms do not improve, even after treatment.  You have more discharge or pain when urinating.  You have a fever.  You have pain in your abdomen.  You have pain during sex.  You have vaginal bleeding between periods. Summary  Bacterial vaginosis is a vaginal infection that occurs when the normal balance of bacteria in the vagina is disrupted.  Because bacterial vaginosis increases your risk for STIs (sexually transmitted infections), getting treated  can help reduce your risk for chlamydia, gonorrhea, herpes, and HIV (human immunodeficiency virus). Treatment is also important for preventing complications in pregnant women, because the condition can cause an early (premature) delivery.  This condition is treated with antibiotic medicines. These may be given as a pill, a vaginal cream, or a medicine that is put into the vagina (suppository). This information is not intended to replace advice given to you by your health care provider. Make sure you discuss any questions you have with your health care provider. Document Revised: 09/17/2017 Document Reviewed: 06/20/2016 Elsevier Patient Education  2020 [employer]     Round Ligament Pain  The round ligament is a cord of muscle and tissue that helps support the uterus. It can become a source of pain during pregnancy if it becomes stretched or twisted as the baby grows. The pain usually begins in the second trimester (13-28 weeks) of pregnancy, and it can come and go until the baby is delivered. It is not a serious problem, and it does not cause harm to the baby. Round ligament pain is usually a short, sharp, and pinching pain, but it can also be a dull, lingering, and aching pain. The pain is felt in the lower side of the abdomen or in the groin. It usually starts deep in  the groin and moves up to the outside of the hip area. The pain may occur when you:  Suddenly change position, such as quickly going from a sitting to standing position.  Roll over in bed.  Cough or sneeze.  Do physical activity. Follow these instructions at home:   Watch your condition for any changes.  When the pain starts, relax. Then try any of these methods to help with the pain: ? Sitting down. ? Flexing your knees up to your abdomen. ? Lying on your side with one pillow under your abdomen and another pillow between your legs. ? Sitting in a warm bath for 15-20 minutes or until the pain goes away.  Take over-the-counter and prescription medicines only as told by your health care provider.  Move slowly when you sit down or stand up.  Avoid long walks if they cause pain.  Stop or reduce your physical activities if they cause pain.  Keep all follow-up visits as told by your health care provider. This is important. Contact a health care provider if:  Your pain does not go away with treatment.  You feel pain in your back that you did not have before.  Your medicine is not helping. Get help right away if:  You have a fever or chills.  You develop uterine contractions.  You have vaginal bleeding.  You have nausea or vomiting.  You have diarrhea.  You have pain when you urinate. Summary  Round ligament pain is felt in the lower abdomen or groin. It is usually a short, sharp, and pinching pain. It can also be a dull, lingering, and aching pain.  This pain usually begins in the second trimester (13-28 weeks). It occurs because the uterus is stretching with the growing baby, and it is not harmful to the baby.  You may notice the pain when you suddenly change position, when you cough or sneeze, or during physical activity.  Relaxing, flexing your knees to your abdomen, lying on one side, or taking a warm bath may help to get rid of the pain.  Get help from your  health care provider if the pain does not go away or if  you have vaginal bleeding, nausea, vomiting, diarrhea, or painful urination. This information is not intended to replace advice given to you by your health care provider. Make sure you discuss any questions you have with your health care provider. Document Revised: 03/23/2018 Document Reviewed: 03/23/2018 Elsevier Patient Education  2020 [employer].     Preterm Labor and Birth Information  The normal length of a pregnancy is 39-41 weeks. Preterm labor is when labor starts before 37 completed weeks of pregnancy. What are the risk factors for preterm labor? Preterm labor is more likely to occur in women who:  Have certain infections during pregnancy such as a bladder infection, sexually transmitted infection, or infection inside the uterus (chorioamnionitis).  Have a shorter-than-normal cervix.  Have gone into preterm labor before.  Have had surgery on their cervix.  Are younger than age 31 or older than age 87.  Are African American.  Are pregnant with twins or multiple babies (multiple gestation).  Take street drugs or smoke while pregnant.  Do not gain enough weight while pregnant.  Became pregnant shortly after having been pregnant. What are the symptoms of preterm labor? Symptoms of preterm labor include:  Cramps similar to those that can happen during a menstrual period. The cramps may happen with diarrhea.  Pain in the abdomen or lower back.  Regular uterine contractions that may feel like tightening of the abdomen.  A feeling of increased pressure in the pelvis.  Increased watery or bloody mucus discharge from the vagina.  Water breaking (ruptured amniotic sac). Why is it important to recognize signs of preterm labor? It is important to recognize signs of preterm labor because babies who are born prematurely may not be fully developed. This can put them at an increased risk for:  Long-term (chronic)  heart and lung problems.  Difficulty immediately after birth with regulating body systems, including blood sugar, body temperature, heart rate, and breathing rate.  Bleeding in the brain.  Cerebral palsy.  Learning difficulties.  Death. These risks are highest for babies who are born before 34 weeks of pregnancy. How is preterm labor treated? Treatment depends on the length of your pregnancy, your condition, and the health of your baby. It may involve:  Having a stitch (suture) placed in your cervix to prevent your cervix from opening too early (cerclage).  Taking or being given medicines, such as: ? Hormone medicines. These may be given early in pregnancy to help support the pregnancy. ? Medicine to stop contractions. ? Medicines to help mature the baby's lungs. These may be prescribed if the risk of delivery is high. ? Medicines to prevent your baby from developing cerebral palsy. If the labor happens before 34 weeks of pregnancy, you may need to stay in the hospital. What should I do if I think I am in preterm labor? If you think that you are going into preterm labor, call your health care provider right away. How can I prevent preterm labor in future pregnancies? To increase your chance of having a full-term pregnancy:  Do not use any tobacco products, such as cigarettes, chewing tobacco, and e-cigarettes. If you need help quitting, ask your health care provider.  Do not use street drugs or medicines that have not been prescribed to you during your pregnancy.  Talk with your health care provider before taking any herbal supplements, even if you have been taking them regularly.  Make sure you gain a healthy amount of weight during your pregnancy.  Watch for infection. If  you think that you might have an infection, get it checked right away.  Make sure to tell your health care provider if you have gone into preterm labor before. This information is not intended to replace  advice given to you by your health care provider. Make sure you discuss any questions you have with your health care provider. Document Revised: 01/27/2019 Document Reviewed: 02/26/2016 Elsevier Patient Education  2020 [employer].

## 2020-02-04 LAB — CULTURE, OB URINE

## 2020-02-05 LAB — GC/CHLAMYDIA PROBE AMP (~~LOC~~) NOT AT ARMC
Chlamydia: NEGATIVE
Comment: NEGATIVE
Comment: NORMAL
Neisseria Gonorrhea: NEGATIVE

## 2020-02-07 ENCOUNTER — Ambulatory Visit (HOSPITAL_COMMUNITY)
Admission: RE | Admit: 2020-02-07 | Discharge: 2020-02-07 | Disposition: A | Discharge status: Home / Self Care | Payer: Medicaid Other | Source: Ambulatory Visit | Attending: Obstetrics and Gynecology | Admitting: Obstetrics and Gynecology

## 2020-02-07 ENCOUNTER — Other Ambulatory Visit (HOSPITAL_COMMUNITY): Payer: Self-pay | Admitting: *Deleted

## 2020-02-07 ENCOUNTER — Encounter (HOSPITAL_COMMUNITY): Payer: Self-pay

## 2020-02-07 ENCOUNTER — Other Ambulatory Visit: Payer: Self-pay

## 2020-02-07 ENCOUNTER — Ambulatory Visit (HOSPITAL_COMMUNITY): Payer: Medicaid Other | Admitting: *Deleted

## 2020-02-07 DIAGNOSIS — O99212 Obesity complicating pregnancy, second trimester: Secondary | ICD-10-CM

## 2020-02-07 DIAGNOSIS — O09899 Supervision of other high risk pregnancies, unspecified trimester: Secondary | ICD-10-CM | POA: Diagnosis not present

## 2020-02-07 DIAGNOSIS — O09892 Supervision of other high risk pregnancies, second trimester: Secondary | ICD-10-CM | POA: Diagnosis not present

## 2020-02-07 DIAGNOSIS — Z362 Encounter for other antenatal screening follow-up: Secondary | ICD-10-CM | POA: Insufficient documentation

## 2020-02-07 DIAGNOSIS — Z3480 Encounter for supervision of other normal pregnancy, unspecified trimester: Secondary | ICD-10-CM | POA: Insufficient documentation

## 2020-02-07 DIAGNOSIS — O09292 Supervision of pregnancy with other poor reproductive or obstetric history, second trimester: Secondary | ICD-10-CM | POA: Diagnosis not present

## 2020-02-07 DIAGNOSIS — Z8632 Personal history of gestational diabetes: Secondary | ICD-10-CM

## 2020-02-07 DIAGNOSIS — Z3A23 23 weeks gestation of pregnancy: Secondary | ICD-10-CM

## 2020-02-08 ENCOUNTER — Encounter: Payer: Self-pay | Admitting: Obstetrics

## 2020-02-08 ENCOUNTER — Telehealth (INDEPENDENT_AMBULATORY_CARE_PROVIDER_SITE_OTHER): Payer: Medicaid Other | Admitting: Obstetrics

## 2020-02-08 DIAGNOSIS — Z3A24 24 weeks gestation of pregnancy: Secondary | ICD-10-CM

## 2020-02-08 DIAGNOSIS — Z8759 Personal history of other complications of pregnancy, childbirth and the puerperium: Secondary | ICD-10-CM

## 2020-02-08 DIAGNOSIS — E669 Obesity, unspecified: Secondary | ICD-10-CM

## 2020-02-08 DIAGNOSIS — O99332 Smoking (tobacco) complicating pregnancy, second trimester: Secondary | ICD-10-CM

## 2020-02-08 DIAGNOSIS — Z3480 Encounter for supervision of other normal pregnancy, unspecified trimester: Secondary | ICD-10-CM

## 2020-02-08 DIAGNOSIS — O99210 Obesity complicating pregnancy, unspecified trimester: Secondary | ICD-10-CM

## 2020-02-08 DIAGNOSIS — F17200 Nicotine dependence, unspecified, uncomplicated: Secondary | ICD-10-CM

## 2020-02-08 DIAGNOSIS — O99212 Obesity complicating pregnancy, second trimester: Secondary | ICD-10-CM

## 2020-02-08 DIAGNOSIS — O09899 Supervision of other high risk pregnancies, unspecified trimester: Secondary | ICD-10-CM

## 2020-02-08 NOTE — Progress Notes (Signed)
S/w pt for virtual visit, pt reports fetal movement, denies pain. Pt does not have BP cuff because she is at work.

## 2020-02-08 NOTE — Progress Notes (Signed)
OBSTETRICS PRENATAL VIRTUAL VISIT ENCOUNTER NOTE  Provider location: [facility] [employer] [facility]   I connected with [name] on 02/08/20 at  9:30 AM EDT by MyChart Video Encounter at home and verified that I am speaking with the correct person using two identifiers.   I discussed the limitations, risks, security and privacy concerns of performing an evaluation and management service virtually and the availability of in person appointments. I also discussed with the patient that there may be a patient responsible charge related to this service. The patient expressed understanding and agreed to proceed. Subjective:  Taylor Reed is a 24 y.o. G2P1001 at [redacted]w[redacted]d being seen today for ongoing prenatal care.  She is currently monitored for the following issues for this low-risk pregnancy and has Herpes genitalia; Latex allergy; Acne; Maternal morbid obesity, antepartum ([place]); SVD (spontaneous vaginal delivery); Supervision of other normal pregnancy, antepartum; History of gestational hypertension; Short interval between pregnancies complicating pregnancy, antepartum; and GBS bacteriuria on their problem list.  Patient reports no complaints.  Contractions: Not present. Vag. Bleeding: None.  Movement: Present. Denies any leaking of fluid.   The following portions of the patient's history were reviewed and updated as appropriate: allergies, current medications, past family history, past medical history, past social history, past surgical history and problem list.   Objective:  There were no vitals filed for this visit.  Fetal Status:     Movement: Present     General:  Alert, oriented and cooperative. Patient is in no acute distress.  Respiratory: Normal respiratory effort, no problems with respiration noted  Mental Status: Normal mood and affect. Normal behavior. Normal judgment and thought content.  Rest of physical exam deferred due to type of encounter  Imaging: [place] MFM  OB FOLLOW UP  Result Date: 02/07/2020 ----------------------------------------------------------------------  OBSTETRICS REPORT                       (Signed Final 02/07/2020 03:18 pm) ---------------------------------------------------------------------- Patient Info  ID #:       [id number]                          D.O.B.:  07/06/1996 (24 yrs)  Name:       Taylor Reed               Visit Date: 02/07/2020 02:38 pm ---------------------------------------------------------------------- Performed By  Performed By:     Valda Favia          Ref. Address:     Faculty                    RDMS  Attending:        Johnell Comings MD         [facility]  Referred By:      Lajean Manes CNM ---------------------------------------------------------------------- Orders   #  Description  Code         Ordered By   1  [place] MFM OB FOLLOW UP                  [id number]     Noralee Space  ----------------------------------------------------------------------   #  Order #                    Accession #                 Episode #   1  [id number]                  [id number]                  [id number]  ---------------------------------------------------------------------- Indications   Obesity complicating pregnancy, second         O99.212   trimester   Poor obstetric history: Previous               O09.299   preeclampsia / eclampsia/gestational HTN   [redacted] weeks gestation of pregnancy                Z3A.23   Low Risk NIPS   Short interval between pregancies, 2nd         O09.892   trimester   Encounter for other antenatal screening        Z36.2   follow-up  ---------------------------------------------------------------------- Fetal Evaluation  Num Of Fetuses:         1  Fetal Heart Rate(bpm):  148  Cardiac Activity:       Observed  Presentation:           Breech  Placenta:               Anterior  P. Cord  Insertion:      Previously Visualized  Amniotic Fluid  AFI FV:      Within normal limits                              Largest Pocket(cm)                              3.95 ---------------------------------------------------------------------- Biometry  BPD:      53.1  mm     G. Age:  22w 1d          3  %    CI:        71.23   %    70 - 86                                                          FL/HC:      20.4   %    18.7 - 20.9  HC:      200.4  mm     G. Age:  22w 1d          1  %    HC/AC:      1.06        1.05 - 1.21  AC:      188.3  mm     G. Age:  23w 4d  33  %    FL/BPD:     77.0   %    71 - 87  FL:       40.9  mm     G. Age:  23w 2d         20  %    FL/AC:      21.7   %    20 - 24  Est. FW:     578  gm      1 lb 4 oz     18  % ---------------------------------------------------------------------- OB History  Gravidity:    2         Term:   1  Living:       1 ---------------------------------------------------------------------- Gestational Age  LMP:           23w 6d        Date:  08/24/19                 EDD:   05/30/20  U/S Today:     22w 6d                                        EDD:   06/06/20  Best:          23w 6d     Det. By:  LMP  (08/24/19)          EDD:   05/30/20 ---------------------------------------------------------------------- Anatomy  Cranium:               Appears normal         Aortic Arch:            Previously seen  Cavum:                 Previously seen        Ductal Arch:            Previously seen  Ventricles:            Previously seen        Diaphragm:              Previously seen  Choroid Plexus:        Resolved CPC           Stomach:                Appears normal, left                                                                        sided  Cerebellum:            Previously seen        Abdomen:                Previously seen  Posterior Fossa:       Previously seen        Abdominal Wall:         Previously seen  Nuchal Fold:           Previously seen        Cord Vessels:  Previously seen  Face:                  Orbits and profile     Kidneys:                Appear normal                         previously seen  Lips:                  Previously seen        Bladder:                Appears normal  Thoracic:              Appears normal         Spine:                  Limited views                                                                        previously seen  Heart:                 Echogenic focus        Upper Extremities:      Previously seen                         in LV  RVOT:                  Appears normal         Lower Extremities:      Previously seen  LVOT:                  Appears normal  Other:  Technically difficult due to maternal habitus and fetal position. ---------------------------------------------------------------------- Cervix Uterus Adnexa  Cervix  Length:            4.4  cm.  Normal appearance by transabdominal scan.  Uterus  No abnormality visualized.  Left Ovary  No adnexal mass visualized.  Right Ovary  No adnexal mass visualized.  Cul De Sac  No free fluid seen.  Adnexa  No abnormality visualized. ---------------------------------------------------------------------- Comments  This patient was seen for a follow up exam as bilateral  choroid plexus cysts were noted during her last ultrasound  exam.  She denies any problems since her last exam.  She was informed that the fetal growth and amniotic fluid  level appears appropriate for her gestational age.  The previously noted bilateral choroid plexus cysts have  resolved.  The echogenic focus is still noted today.  As the fetal growth is in the lower normal range, a follow-up  exam was scheduled in 4 weeks. ----------------------------------------------------------------------                   Ma RingsVictor Fang, MD Electronically Signed Final Report   02/07/2020 03:18 pm ----------------------------------------------------------------------  US MFM OB LIMITED  Result Date:  02/03/2020 ----------------------------------------------------------------------  OBSTETRICS REPORT                       (Signed Final 02/03/2020 03:19 pm) ----------------------------------------------------------------------  Patient Info  ID #:       [id number]                          D.O.B.:  08/14/1996 (23 yrs)  Name:       Taylor Reed               Visit Date: 02/03/2020 12:10 pm ---------------------------------------------------------------------- Performed By  Performed By:     Birdena Crandall        Ref. Address:      Faculty                    RDMS,RVT  Attending:        Ma Rings MD         [facility]  Referred By:      Sharyon Cable CNM ---------------------------------------------------------------------- Orders   #  Description                          Code         Ordered By   1  [place] MFM OB LIMITED                    40981.19     NICOLE NUGENT  ----------------------------------------------------------------------   #  Order #                    Accession #                 Episode #   1  [id number]                  [id number]                  [id number]  ---------------------------------------------------------------------- Indications   Vaginal bleeding   Abdominal pain in pregnancy                    O99.89   Encounter for cervical length                  Z36.86   Fetal choroid plexus cyst; Echogenic           O35.8XX0   intracardiac focus of the heart  (EIF)   Obesity complicating pregnancy, second         O99.212   trimester   Low Risk NIPS   Poor obstetric history: Previous               O09.299   preeclampsia / eclampsia/gestational HTN   Short interval between pregancies, 2nd         O09.892   trimester   [redacted] weeks gestation of pregnancy                Z3A.23  ---------------------------------------------------------------------- Fetal Evaluation  Num Of Fetuses:  1  Fetal Heart Rate(bpm):   154  Cardiac Activity:        Observed  Presentation:            Transverse, head to maternal left  Placenta:                Anterior  P. Cord Insertion:       Visualized, central  Amniotic Fluid  AFI FV:      Within normal limits                              Largest Pocket(cm)                              4.02  Comment:    No placental abruption or previa identified. ---------------------------------------------------------------------- OB History  Gravidity:    2         Term:   1  Living:       1 ---------------------------------------------------------------------- Gestational Age  LMP:           23w 2d        Date:  08/24/19                 EDD:   05/30/20  Best:          23w 2d     Det. By:  LMP  (08/24/19)          EDD:   05/30/20 ---------------------------------------------------------------------- Anatomy  Cranium:               Appears normal         Heart:                  Echogenic focus                                                                        in RV  Cavum:                 Appears normal         Stomach:                Appears normal, left                                                                        sided  Ventricles:            Appears normal         Abdominal Wall:         Appears nml (cord  insert, abd wall)  Choroid Plexus:        Appears normal         Kidneys:                Appear normal  Cerebellum:            Appears normal         Bladder:                Appears normal  Posterior Fossa:       Appears normal ---------------------------------------------------------------------- Cervix Uterus Adnexa  Cervix  Length:           4.48  cm.  Normal appearance by transabdominal scan.  Uterus  No abnormality visualized.  Left Ovary  No adnexal mass visualized.  Right Ovary  No adnexal mass visualized.  Cul De Sac  No free fluid seen.  Adnexa  No adnexal mass visualized.  ---------------------------------------------------------------------- Comments  A limited ultrasound performed today shows that the fetus is  in the breech/transverse  presentation.  There was normal amniotic fluid noted. ----------------------------------------------------------------------                   Ma Rings, MD Electronically Signed Final Report   02/03/2020 03:19 pm ----------------------------------------------------------------------   Assessment and Plan:  Pregnancy: G2P1001 at [redacted]w[redacted]d 1. Supervision of other normal pregnancy, antepartum  2. Short interval between pregnancies complicating pregnancy, antepartum  3. Obesity affecting pregnancy, antepartum  4. History of gestational hypertension - taking Baby ASA  5. Tobacco dependence   Preterm labor symptoms and general obstetric precautions including but not limited to vaginal bleeding, contractions, leaking of fluid and fetal movement were reviewed in detail with the patient. I discussed the assessment and treatment plan with the patient. The patient was provided an opportunity to ask questions and all were answered. The patient agreed with the plan and demonstrated an understanding of the instructions. The patient was advised to call back or seek an in-person office evaluation/go to [facility] at [facility] for any urgent or concerning symptoms. Please refer to After Visit Summary for other counseling recommendations.   I provided 10 minutes of face-to-face time during this encounter.  Return in about 4 weeks (around 03/07/2020) for ROB, 2 hour OGTT.  Future Appointments  Date Time Provider Department Center  03/07/2020  3:15 PM [facility] NURSE [facility]  03/07/2020  3:15 PM [facility] [place] 2 [facility]-M[facility]US [facility]    Coral Ceo, MD [facility], [facility] 02/08/2020

## 2020-03-06 ENCOUNTER — Ambulatory Visit (INDEPENDENT_AMBULATORY_CARE_PROVIDER_SITE_OTHER): Payer: Medicaid Other | Admitting: Women's Health

## 2020-03-06 ENCOUNTER — Other Ambulatory Visit: Payer: Medicaid Other

## 2020-03-06 ENCOUNTER — Other Ambulatory Visit: Payer: Self-pay

## 2020-03-06 VITALS — BP 123/81 | HR 93 | Wt 241.5 lb

## 2020-03-06 DIAGNOSIS — R8271 Bacteriuria: Secondary | ICD-10-CM

## 2020-03-06 DIAGNOSIS — Z3A27 27 weeks gestation of pregnancy: Secondary | ICD-10-CM

## 2020-03-06 DIAGNOSIS — O99212 Obesity complicating pregnancy, second trimester: Secondary | ICD-10-CM

## 2020-03-06 DIAGNOSIS — Z23 Encounter for immunization: Secondary | ICD-10-CM | POA: Diagnosis not present

## 2020-03-06 DIAGNOSIS — Z3480 Encounter for supervision of other normal pregnancy, unspecified trimester: Secondary | ICD-10-CM

## 2020-03-06 DIAGNOSIS — Z8759 Personal history of other complications of pregnancy, childbirth and the puerperium: Secondary | ICD-10-CM

## 2020-03-06 DIAGNOSIS — A6000 Herpesviral infection of urogenital system, unspecified: Secondary | ICD-10-CM

## 2020-03-06 MED ORDER — TETANUS-DIPHTH-ACELL PERTUSSIS 5-2.5-18.5 LF-MCG/0.5 IM SUSP
0.5000 mL | Freq: Once | INTRAMUSCULAR | Status: AC
  Administered 2020-03-06: 0.5 mL via INTRAMUSCULAR

## 2020-03-06 MED ORDER — PRENATAL GUMMIES/DHA & FA 0.4-32.5 MG PO CHEW: 3.0000 | CHEWABLE_TABLET | Freq: Every day | ORAL | 4 refills | Status: DC

## 2020-03-06 NOTE — Patient Instructions (Addendum)
Maternity Assessment Unit (MAU)  The Maternity Assessment Unit (MAU) is located at the [facility] and [place] at [facility]. The address is: [address] [place], [place], [place] [address]. Please see map below for additional directions.    The Maternity Assessment Unit is designed to help you during your pregnancy, and for up to 6 weeks after delivery, with any pregnancy- or postpartum-related emergencies, if you think you are in labor, or if your water has broken. For example, if you experience nausea and vomiting, vaginal bleeding, severe abdominal or pelvic pain, elevated blood pressure or other problems related to your pregnancy or postpartum time, please come to the Maternity Assessment Unit for assistance.        Preterm Labor and Birth Information  The normal length of a pregnancy is 39-41 weeks. Preterm labor is when labor starts before 37 completed weeks of pregnancy. What are the risk factors for preterm labor? Preterm labor is more likely to occur in women who:  Have certain infections during pregnancy such as a bladder infection, sexually transmitted infection, or infection inside the uterus (chorioamnionitis).  Have a shorter-than-normal cervix.  Have gone into preterm labor before.  Have had surgery on their cervix.  Are younger than age 64 or older than age 26.  Are African American.  Are pregnant with twins or multiple babies (multiple gestation).  Take street drugs or smoke while pregnant.  Do not gain enough weight while pregnant.  Became pregnant shortly after having been pregnant. What are the symptoms of preterm labor? Symptoms of preterm labor include:  Cramps similar to those that can happen during a menstrual period. The cramps may happen with diarrhea.  Pain in the abdomen or lower back.  Regular uterine contractions that may feel like tightening of the abdomen.  A feeling of increased pressure in the  pelvis.  Increased watery or bloody mucus discharge from the vagina.  Water breaking (ruptured amniotic sac). Why is it important to recognize signs of preterm labor? It is important to recognize signs of preterm labor because babies who are born prematurely may not be fully developed. This can put them at an increased risk for:  Long-term (chronic) heart and lung problems.  Difficulty immediately after birth with regulating body systems, including blood sugar, body temperature, heart rate, and breathing rate.  Bleeding in the brain.  Cerebral palsy.  Learning difficulties.  Death. These risks are highest for babies who are born before 38 weeks of pregnancy. How is preterm labor treated? Treatment depends on the length of your pregnancy, your condition, and the health of your baby. It may involve:  Having a stitch (suture) placed in your cervix to prevent your cervix from opening too early (cerclage).  Taking or being given medicines, such as: ? Hormone medicines. These may be given early in pregnancy to help support the pregnancy. ? Medicine to stop contractions. ? Medicines to help mature the baby's lungs. These may be prescribed if the risk of delivery is high. ? Medicines to prevent your baby from developing cerebral palsy. If the labor happens before 34 weeks of pregnancy, you may need to stay in the hospital. What should I do if I think I am in preterm labor? If you think that you are going into preterm labor, call your health care provider right away. How can I prevent preterm labor in future pregnancies? To increase your chance of having a full-term pregnancy:  Do not use any tobacco products, such as  cigarettes, chewing tobacco, and e-cigarettes. If you need help quitting, ask your health care provider.  Do not use street drugs or medicines that have not been prescribed to you during your pregnancy.  Talk with your health care provider before taking any herbal  supplements, even if you have been taking them regularly.  Make sure you gain a healthy amount of weight during your pregnancy.  Watch for infection. If you think that you might have an infection, get it checked right away.  Make sure to tell your health care provider if you have gone into preterm labor before. This information is not intended to replace advice given to you by your health care provider. Make sure you discuss any questions you have with your health care provider. Document Revised: 01/27/2019 Document Reviewed: 02/26/2016 Elsevier Patient Education  [place].        Glucose Tolerance Test During Pregnancy Why am I having this test? The glucose tolerance test (GTT) is done to check how your body processes sugar (glucose). This is one of several tests used to diagnose diabetes that develops during pregnancy (gestational diabetes mellitus). Gestational diabetes is a temporary form of diabetes that some women develop during pregnancy. It usually occurs during the second trimester of pregnancy and goes away after delivery. Testing (screening) for gestational diabetes usually occurs between 24 and 28 weeks of pregnancy. You may have the GTT test after having a 1-hour glucose screening test if the results from that test indicate that you may have gestational diabetes. You may also have this test if:  You have a history of gestational diabetes.  You have a history of giving birth to very large babies or have experienced repeated fetal loss (stillbirth).  You have signs and symptoms of diabetes, such as: ? Changes in your vision. ? Tingling or numbness in your hands or feet. ? Changes in hunger, thirst, and urination that are not otherwise explained by your pregnancy. What is being tested? This test measures the amount of glucose in your blood at different times during a period of 3 hours. This indicates how well your body is able to process glucose. What kind of  sample is taken?  Blood samples are required for this test. They are usually collected by inserting a needle into a blood vessel. How do I prepare for this test?  For 3 days before your test, eat normally. Have plenty of carbohydrate-rich foods.  Follow instructions from your health care provider about: ? Eating or drinking restrictions on the day of the test. You may be asked to not eat or drink anything other than water (fast) starting 8-10 hours before the test. ? Changing or stopping your regular medicines. Some medicines may interfere with this test. Tell a health care provider about:  All medicines you are taking, including vitamins, herbs, eye drops, creams, and over-the-counter medicines.  Any blood disorders you have.  Any surgeries you have had.  Any medical conditions you have. What happens during the test? First, your blood glucose will be measured. This is referred to as your fasting blood glucose, since you fasted before the test. Then, you will drink a glucose solution that contains a certain amount of glucose. Your blood glucose will be measured again 1, 2, and 3 hours after drinking the solution. This test takes about 3 hours to complete. You will need to stay at the testing location during this time. During the testing period:  Do not eat or drink anything other than  the glucose solution.  Do not exercise.  Do not use any products that contain nicotine or tobacco, such as cigarettes and e-cigarettes. If you need help stopping, ask your health care provider. The testing procedure may vary among health care providers and hospitals. How are the results reported? Your results will be reported as milligrams of glucose per deciliter of blood (mg/dL) or millimoles per liter (mmol/L). Your health care provider will compare your results to normal ranges that were established after testing a large group of people (reference ranges). Reference ranges may vary among labs and  hospitals. For this test, common reference ranges are:  Fasting: less than 95-105 mg/dL (4.4-0.15.3-5.8 mmol/L).  1 hour after drinking glucose: less than 180-190 mg/dL (02.7-25.310.0-10.5 mmol/L).  2 hours after drinking glucose: less than 155-165 mg/dL (6.6-4.48.6-9.2 mmol/L).  3 hours after drinking glucose: 140-145 mg/dL (0.3-4.77.8-8.1 mmol/L). What do the results mean? Results within reference ranges are considered normal, meaning that your glucose levels are well-controlled. If two or more of your blood glucose levels are high, you may be diagnosed with gestational diabetes. If only one level is high, your health care provider may suggest repeat testing or other tests to confirm a diagnosis. Talk with your health care provider about what your results mean. Questions to ask your health care provider Ask your health care provider, or the department that is doing the test:  When will my results be ready?  How will I get my results?  What are my treatment options?  What other tests do I need?  What are my next steps? Summary  The glucose tolerance test (GTT) is one of several tests used to diagnose diabetes that develops during pregnancy (gestational diabetes mellitus). Gestational diabetes is a temporary form of diabetes that some women develop during pregnancy.  You may have the GTT test after having a 1-hour glucose screening test if the results from that test indicate that you may have gestational diabetes. You may also have this test if you have any symptoms or risk factors for gestational diabetes.  Talk with your health care provider about what your results mean. This information is not intended to replace advice given to you by your health care provider. Make sure you discuss any questions you have with your health care provider. Document Revised: 01/26/2019 Document Reviewed: 05/17/2017 Elsevier Patient Education  2020 Elsevier Inc.        Pregnancy and Genital Herpes  Genital herpes is an  STI (sexually transmitted infection) that is caused by the herpes simplex virus (HSV). HSV can cause an outbreak of itching, blisters, and sores (ulcers) around the genitals and rectum. Even when the outbreak goes away, the virus stays in the body. If you are pregnant, you can pass HSV to your baby. If you become infected with HSV for the first time while you are pregnant, the virus can cause serious problems for your baby. If you had HSV before your pregnancy, the virus may not affect your baby as seriously. Babies that are infected with HSV are at risk for developing inflammation of the brain (encephalitis), damage to organs, and problems with development. How does this affect me? Your type of delivery may be affected. You may be able to have a vaginal delivery if you have no evidence of an outbreak when you go into labor. However, your baby may need to be delivered by C-section (cesarean delivery) if you have:  An active, recurrent, or new herpes outbreak at the time of delivery. This  is because the virus can pass to your baby through an infected birth canal. This can cause severe problems for your baby.  Any symptoms of infection in the areas around the genitals such as pain, burning, and itching, even if you do not have any ulcers in the birth canal. After delivery, you can breastfeed your baby. The virus will not be present in breast milk. While caring for your baby, you will need to take steps to avoid passing the virus on to your baby. How does this affect my baby? If the virus passes to your baby, it can cause serious problems. The virus can be passed to your baby:  Before delivery. The virus can be passed to your unborn baby through the placenta. This is more likely to happen if you get herpes for the first time in the first 3 months of pregnancy (first trimester). This may cause your baby to have a congenital disability.  During delivery. This is more likely to happen if you become infected  for the first time late in your pregnancy.  After delivery. Your baby can get a herpes infection if you touch active ulcers and then touch your baby without washing your hands. The virus is less likely to pass to your baby if you had herpes before you became pregnant. This is because antibodies against the virus develop over a period of time. These antibodies help to protect the baby. How is this treated? This condition can be treated with medicines during pregnancy that are safe for you and your baby. These medicines can help to reduce symptoms, shorten an outbreak, and prevent another outbreak of the infection. If the infection happened before you became pregnant, you may need to take medicine late in your pregnancy to help to prevent a breakout at the time of delivery. Follow these instructions at home: To avoid passing the virus to your baby:  Wash your hands with soap and water often and before touching your baby.  If you have an outbreak, keep the area clean and covered.  If ulcers are present on your breast, do not breastfeed from the affected breast. Contact a health care provider if:  You have a rash, blisters, or ulcers in the area around your genitals or rectum.  You have burning, itching, or pain in the area around your genitals or rectum.  You have trouble urinating. Summary  Genital herpes is an STI (sexually transmitted infection) that is caused by the herpes simplex virus (HSV). If you are pregnant, you can pass the virus to your baby.  Even when the outbreak goes away, the virus stays in your body.  Genital herpes can be passed to your unborn or newborn baby and cause serious problems.  This condition can be treated with medicines during pregnancy that are safe for you and your baby. Medicines can treat your symptoms, shorten the length of an outbreak, and prevent another outbreak of the infection.  If you have signs or symptoms of a herpes outbreak when you go into  labor, your health care provider may recommend a C-section (cesarean delivery) to lower the risk of passing the virus to your baby. This information is not intended to replace advice given to you by your health care provider. Make sure you discuss any questions you have with your health care provider. Document Revised: 01/27/2019 Document Reviewed: 01/05/2019 Elsevier Patient Education  2020 [employer].       https://www.cdc.gov/vaccines/hcp/vis/vis-statements/tdap.pdf">  Tdap (Tetanus, Diphtheria, Pertussis) Vaccine: What You Need  to Know 1. Why get vaccinated? Tdap vaccine can prevent tetanus, diphtheria, and pertussis. Diphtheria and pertussis spread from person to person. Tetanus enters the body through cuts or wounds.  TETANUS (T) causes painful stiffening of the muscles. Tetanus can lead to serious health problems, including being unable to open the mouth, having trouble swallowing and breathing, or death.  DIPHTHERIA (D) can lead to difficulty breathing, heart failure, paralysis, or death.  PERTUSSIS (aP), also known as "whooping cough," can cause uncontrollable, violent coughing which makes it hard to breathe, eat, or drink. Pertussis can be extremely serious in babies and young children, causing pneumonia, convulsions, brain damage, or death. In teens and adults, it can cause weight loss, loss of bladder control, passing out, and rib fractures from severe coughing. 2. Tdap vaccine Tdap is only for children 7 years and older, adolescents, and adults.  Adolescents should receive a single dose of Tdap, preferably at age 23 or 20 years. Pregnant women should get a dose of Tdap during every pregnancy, to protect the newborn from pertussis. Infants are most at risk for severe, life-threatening complications from pertussis. Adults who have never received Tdap should get a dose of Tdap. Also, adults should receive a booster dose every 10 years, or earlier in the case of a severe and  dirty wound or burn. Booster doses can be either Tdap or Td (a different vaccine that protects against tetanus and diphtheria but not pertussis). Tdap may be given at the same time as other vaccines. 3. Talk with your health care provider Tell your vaccine provider if the person getting the vaccine:  Has had an allergic reaction after a previous dose of any vaccine that protects against tetanus, diphtheria, or pertussis, or has any severe, life-threatening allergies.  Has had a coma, decreased level of consciousness, or prolonged seizures within 7 days after a previous dose of any pertussis vaccine (DTP, DTaP, or Tdap).  Has seizures or another nervous system problem.  Has ever had Guillain-Barr Syndrome (also called GBS).  Has had severe pain or swelling after a previous dose of any vaccine that protects against tetanus or diphtheria. In some cases, your health care provider may decide to postpone Tdap vaccination to a future visit.  People with minor illnesses, such as a cold, may be vaccinated. People who are moderately or severely ill should usually wait until they recover before getting Tdap vaccine.  Your health care provider can give you more information. 4. Risks of a vaccine reaction  Pain, redness, or swelling where the shot was given, mild fever, headache, feeling tired, and nausea, vomiting, diarrhea, or stomachache sometimes happen after Tdap vaccine. People sometimes faint after medical procedures, including vaccination. Tell your provider if you feel dizzy or have vision changes or ringing in the ears.  As with any medicine, there is a very remote chance of a vaccine causing a severe allergic reaction, other serious injury, or death. 5. What if there is a serious problem? An allergic reaction could occur after the vaccinated person leaves the clinic. If you see signs of a severe allergic reaction (hives, swelling of the face and throat, difficulty breathing, a fast heartbeat,  dizziness, or weakness), call 9-1-1 and get the person to the nearest hospital. For other signs that concern you, call your health care provider.  Adverse reactions should be reported to the Vaccine Adverse Event Reporting System (VAERS). Your health care provider will usually file this report, or you can do it yourself. Visit the VAERS  website at www.vaers.LAgents.no or call 352-811-9863. VAERS is only for reporting reactions, and VAERS staff do not give medical advice. 6. The National Vaccine Injury Compensation Program The [employer] Vaccine Injury Compensation Program (VICP) is a federal program that was created to compensate people who may have been injured by certain vaccines. Visit the VICP website at SpiritualWord.at or call (330)842-2443 to learn about the program and about filing a claim. There is a time limit to file a claim for compensation. 7. How can I learn more?  Ask your health care provider.  Call your local or state health department.  Contact the Centers for Disease Control and Prevention (CDC): ? Call 951-132-1740 (1-800-CDC-INFO) or ? Visit CDC's website at PicCapture.uy Vaccine Information Statement Tdap (Tetanus, Diphtheria, Pertussis) Vaccine (01/18/2019) This information is not intended to replace advice given to you by your health care provider. Make sure you discuss any questions you have with your health care provider. Document Revised: 01/27/2019 Document Reviewed: 01/30/2019 Elsevier Patient Education  2020 Elsevier Inc.        Preeclampsia and Eclampsia Preeclampsia is a serious condition that may develop during pregnancy. This condition causes high blood pressure and increased protein in your urine along with other symptoms, such as headaches and vision changes. These symptoms may develop as the condition gets worse. Preeclampsia may occur at 20 weeks of pregnancy or later. Diagnosing and treating preeclampsia early is very  important. If not treated early, it can cause serious problems for you and your baby. One problem it can lead to is eclampsia. Eclampsia is a condition that causes muscle jerking or shaking (convulsions or seizures) and other serious problems for the mother. During pregnancy, delivering your baby may be the best treatment for preeclampsia or eclampsia. For most women, preeclampsia and eclampsia symptoms go away after giving birth. In rare cases, a woman may develop preeclampsia after giving birth (postpartum preeclampsia). This usually occurs within 48 hours after childbirth but may occur up to 6 weeks after giving birth. What are the causes? The cause of preeclampsia is not known. What increases the risk? The following risk factors make you more likely to develop preeclampsia:  Being pregnant for the first time.  Having had preeclampsia during a past pregnancy.  Having a family history of preeclampsia.  Having high blood pressure.  Being pregnant with more than one baby.  Being 66 or older.  Being African-American.  Having kidney disease or diabetes.  Having medical conditions such as lupus or blood diseases.  Being very overweight (obese). What are the signs or symptoms? The most common symptoms are:  Severe headaches.  Vision problems, such as blurred or double vision.  Abdominal pain, especially upper abdominal pain. Other symptoms that may develop as the condition gets worse include:  Sudden weight gain.  Sudden swelling of the hands, face, legs, and feet.  Severe nausea and vomiting.  Numbness in the face, arms, legs, and feet.  Dizziness.  Urinating less than usual.  Slurred speech.  Convulsions or seizures. How is this diagnosed? There are no screening tests for preeclampsia. Your health care provider will ask you about symptoms and check for signs of preeclampsia during your prenatal visits. You may also have tests that include:  Checking your blood  pressure.  Urine tests to check for protein. Your health care provider will check for this at every prenatal visit.  Blood tests.  Monitoring your baby's heart rate.  Ultrasound. How is this treated? You and your health care provider will  determine the treatment approach that is best for you. Treatment may include:  Having more frequent prenatal exams to check for signs of preeclampsia, if you have an increased risk for preeclampsia.  Medicine to lower your blood pressure.  Staying in the hospital, if your condition is severe. There, treatment will focus on controlling your blood pressure and the amount of fluids in your body (fluid retention).  Taking medicine (magnesium sulfate) to prevent seizures. This may be given as an injection or through an IV.  Taking a low-dose aspirin during your pregnancy.  Delivering your baby early. You may have your labor started with medicine (induced), or you may have a cesarean delivery. Follow these instructions at home: Eating and drinking   Drink enough fluid to keep your urine pale yellow.  Avoid caffeine. Lifestyle  Do not use any products that contain nicotine or tobacco, such as cigarettes and e-cigarettes. If you need help quitting, ask your health care provider.  Do not use alcohol or drugs.  Avoid stress as much as possible. Rest and get plenty of sleep. General instructions  Take over-the-counter and prescription medicines only as told by your health care provider.  When lying down, lie on your left side. This keeps pressure off your major blood vessels.  When sitting or lying down, raise (elevate) your feet. Try putting some pillows underneath your lower legs.  Exercise regularly. Ask your health care provider what kinds of exercise are best for you.  Keep all follow-up and prenatal visits as told by your health care provider. This is important. How is this prevented? There is no known way of preventing preeclampsia or  eclampsia from developing. However, to lower your risk of complications and detect problems early:  Get regular prenatal care. Your health care provider may be able to diagnose and treat the condition early.  Maintain a healthy weight. Ask your health care provider for help managing weight gain during pregnancy.  Work with your health care provider to manage any long-term (chronic) health conditions you have, such as diabetes or kidney problems.  You may have tests of your blood pressure and kidney function after giving birth.  Your health care provider may have you take low-dose aspirin during your next pregnancy. Contact a health care provider if:  You have symptoms that your health care provider told you may require more treatment or monitoring, such as: ? Headaches. ? Nausea or vomiting. ? Abdominal pain. ? Dizziness. ? Light-headedness. Get help right away if:  You have severe: ? Abdominal pain. ? Headaches that do not get better. ? Dizziness. ? Vision problems. ? Confusion. ? Nausea or vomiting.  You have any of the following: ? A seizure. ? Sudden, rapid weight gain. ? Sudden swelling in your hands, ankles, or face. ? Trouble moving any part of your body. ? Numbness in any part of your body. ? Trouble speaking. ? Abnormal bleeding.  You faint. Summary  Preeclampsia is a serious condition that may develop during pregnancy.  This condition causes high blood pressure and increased protein in your urine along with other symptoms, such as headaches and vision changes.  Diagnosing and treating preeclampsia early is very important. If not treated early, it can cause serious problems for you and your baby.  Get help right away if you have symptoms that your health care provider told you to watch for. This information is not intended to replace advice given to you by your health care provider. Make sure you discuss any  questions you have with your health care  provider. Document Revised: 06/07/2018 Document Reviewed: 05/11/2016 Elsevier Patient Education  2020 [employer].

## 2020-03-06 NOTE — Progress Notes (Signed)
Subjective:  Taylor Reed is a 24 y.o. G2P1001 at [redacted]w[redacted]d being seen today for ongoing prenatal care.  She is currently monitored for the following issues for this low-risk pregnancy and has Herpes genitalia; Latex allergy; Acne; Maternal morbid obesity, antepartum (HCC); SVD (spontaneous vaginal delivery); Supervision of other normal pregnancy, antepartum; History of gestational hypertension; Short interval between pregnancies complicating pregnancy, antepartum; and GBS bacteriuria on their problem list.  Patient reports no complaints.  Contractions: Not present. Vag. Bleeding: None.  Movement: Present. Denies leaking of fluid.   The following portions of the patient's history were reviewed and updated as appropriate: allergies, current medications, past family history, past medical history, past social history, past surgical history and problem list. Problem list updated.  Objective:   Vitals:   03/06/20 0932  BP: 123/81  Pulse: 93  Weight: 241 lb 8 oz (109.5 kg)    Fetal Status: Fetal Heart Rate (bpm): 142   Movement: Present     General:  Alert, oriented and cooperative. Patient is in no acute distress.  Skin: Skin is warm and dry. No rash noted.   Cardiovascular: Normal heart rate noted  Respiratory: Normal respiratory effort, no problems with respiration noted  Abdomen: Soft, gravid, appropriate for gestational age. Pain/Pressure: Absent     Pelvic: Vag. Bleeding: None     Cervical exam deferred        Extremities: Normal range of motion.  Edema: None  Mental Status: Normal mood and affect. Normal behavior. Normal judgment and thought content.   Urinalysis:      Assessment and Plan:  Pregnancy: G2P1001 at [redacted]w[redacted]d  1. Supervision of other normal pregnancy, antepartum -GBS/labs today -Tdap today  2. History of gestational hypertension -BP today 123/81 -pt taking ASA almost daily  3. GBS bacteriuria -will treat in labor  4. Maternal morbid obesity, antepartum  (HCC) -growth scan scheduled tomorrow, pt aware  5. Genital herpes simplex, unspecified site -suppression at 35/36 weeks  Preterm labor symptoms and general obstetric precautions including but not limited to vaginal bleeding, contractions, leaking of fluid and fetal movement were reviewed in detail with the patient. Please refer to After Visit Summary for other counseling recommendations.  Return in about 2 weeks (around 03/20/2020) for virtual LOB/APP OK.   Sylis Ketchum, Odie Sera, NP

## 2020-03-06 NOTE — Progress Notes (Signed)
Pt presents for ROB and GTT. Patient has no concerns today.

## 2020-03-07 ENCOUNTER — Ambulatory Visit: Payer: Medicaid Other

## 2020-03-07 ENCOUNTER — Other Ambulatory Visit: Payer: Self-pay | Admitting: Women's Health

## 2020-03-07 ENCOUNTER — Encounter: Payer: Self-pay | Admitting: Women's Health

## 2020-03-07 DIAGNOSIS — O99019 Anemia complicating pregnancy, unspecified trimester: Secondary | ICD-10-CM | POA: Insufficient documentation

## 2020-03-07 DIAGNOSIS — O99013 Anemia complicating pregnancy, third trimester: Secondary | ICD-10-CM

## 2020-03-07 LAB — CBC
Hematocrit: 34.2 % (ref 34.0–46.6)
Hemoglobin: 10.4 g/dL — ABNORMAL LOW (ref 11.1–15.9)
MCH: 27.1 pg (ref 26.6–33.0)
MCHC: 30.4 g/dL — ABNORMAL LOW (ref 31.5–35.7)
MCV: 89 fL (ref 79–97)
Platelets: 286 10*3/uL (ref 150–450)
RBC: 3.84 x10E6/uL (ref 3.77–5.28)
RDW: 15.7 % — ABNORMAL HIGH (ref 11.7–15.4)
WBC: 11.6 10*3/uL — ABNORMAL HIGH (ref 3.4–10.8)

## 2020-03-07 LAB — GLUCOSE TOLERANCE, 2 HOURS W/ 1HR
Glucose, 1 hour: 72 mg/dL (ref 65–179)
Glucose, 2 hour: 92 mg/dL (ref 65–152)
Glucose, Fasting: 75 mg/dL (ref 65–91)

## 2020-03-07 LAB — RPR: RPR Ser Ql: NONREACTIVE

## 2020-03-07 LAB — HIV ANTIBODY (ROUTINE TESTING W REFLEX): HIV Screen 4th Generation wRfx: NONREACTIVE

## 2020-03-07 MED ORDER — FERROUS SULFATE 325 (65 FE) MG PO TABS: 325.0000 mg | ORAL_TABLET | Freq: Every day | ORAL | 3 refills | Status: DC

## 2020-03-07 NOTE — Progress Notes (Signed)
RX iron.  Marylen Ponto, NP  11:19 AM 03/07/2020

## 2020-03-13 ENCOUNTER — Ambulatory Visit: Payer: Medicaid Other | Attending: Obstetrics

## 2020-03-13 ENCOUNTER — Other Ambulatory Visit: Payer: Self-pay | Admitting: *Deleted

## 2020-03-13 ENCOUNTER — Other Ambulatory Visit: Payer: Self-pay

## 2020-03-13 ENCOUNTER — Ambulatory Visit: Payer: Medicaid Other | Admitting: *Deleted

## 2020-03-13 DIAGNOSIS — O99210 Obesity complicating pregnancy, unspecified trimester: Secondary | ICD-10-CM

## 2020-03-13 DIAGNOSIS — O99213 Obesity complicating pregnancy, third trimester: Secondary | ICD-10-CM | POA: Diagnosis not present

## 2020-03-13 DIAGNOSIS — Z3A28 28 weeks gestation of pregnancy: Secondary | ICD-10-CM

## 2020-03-13 DIAGNOSIS — O09293 Supervision of pregnancy with other poor reproductive or obstetric history, third trimester: Secondary | ICD-10-CM | POA: Diagnosis not present

## 2020-03-13 DIAGNOSIS — Z8632 Personal history of gestational diabetes: Secondary | ICD-10-CM | POA: Diagnosis not present

## 2020-03-13 DIAGNOSIS — O99013 Anemia complicating pregnancy, third trimester: Secondary | ICD-10-CM | POA: Insufficient documentation

## 2020-03-13 DIAGNOSIS — Z3480 Encounter for supervision of other normal pregnancy, unspecified trimester: Secondary | ICD-10-CM

## 2020-03-13 DIAGNOSIS — Z362 Encounter for other antenatal screening follow-up: Secondary | ICD-10-CM | POA: Diagnosis not present

## 2020-03-13 DIAGNOSIS — O359XX0 Maternal care for (suspected) fetal abnormality and damage, unspecified, not applicable or unspecified: Secondary | ICD-10-CM

## 2020-03-13 DIAGNOSIS — O09893 Supervision of other high risk pregnancies, third trimester: Secondary | ICD-10-CM

## 2020-03-13 DIAGNOSIS — O09899 Supervision of other high risk pregnancies, unspecified trimester: Secondary | ICD-10-CM

## 2020-03-13 DIAGNOSIS — E669 Obesity, unspecified: Secondary | ICD-10-CM | POA: Diagnosis not present

## 2020-03-20 ENCOUNTER — Telehealth (INDEPENDENT_AMBULATORY_CARE_PROVIDER_SITE_OTHER): Payer: Medicaid Other | Admitting: Obstetrics

## 2020-03-20 ENCOUNTER — Encounter: Payer: Self-pay | Admitting: Obstetrics

## 2020-03-20 DIAGNOSIS — D649 Anemia, unspecified: Secondary | ICD-10-CM

## 2020-03-20 DIAGNOSIS — O09899 Supervision of other high risk pregnancies, unspecified trimester: Secondary | ICD-10-CM | POA: Diagnosis not present

## 2020-03-20 DIAGNOSIS — O99210 Obesity complicating pregnancy, unspecified trimester: Secondary | ICD-10-CM | POA: Diagnosis not present

## 2020-03-20 DIAGNOSIS — E669 Obesity, unspecified: Secondary | ICD-10-CM

## 2020-03-20 DIAGNOSIS — O99333 Smoking (tobacco) complicating pregnancy, third trimester: Secondary | ICD-10-CM

## 2020-03-20 DIAGNOSIS — Z3A29 29 weeks gestation of pregnancy: Secondary | ICD-10-CM

## 2020-03-20 DIAGNOSIS — O99013 Anemia complicating pregnancy, third trimester: Secondary | ICD-10-CM

## 2020-03-20 DIAGNOSIS — F17200 Nicotine dependence, unspecified, uncomplicated: Secondary | ICD-10-CM

## 2020-03-20 DIAGNOSIS — Z3480 Encounter for supervision of other normal pregnancy, unspecified trimester: Secondary | ICD-10-CM

## 2020-03-20 NOTE — Progress Notes (Signed)
Pt does not have BP cuff with her today.

## 2020-03-20 NOTE — Progress Notes (Signed)
   TELEHEALTH OBSTETRICS VISIT ENCOUNTER NOTE  I connected with Taylor Reed on 03/20/20 at  9:30 AM EDT by telephone at home and verified that I am speaking with the correct person using two identifiers.   I discussed the limitations, risks, security and privacy concerns of performing an evaluation and management service by telephone and the availability of in person appointments. I also discussed with the patient that there may be a patient responsible charge related to this service. The patient expressed understanding and agreed to proceed.  Subjective:  Taylor Reed is a 24 y.o. G2P1001 at [redacted]w[redacted]d being followed for ongoing prenatal care.  She is currently monitored for the following issues for this low-risk pregnancy and has Herpes genitalia; Latex allergy; Acne; Maternal morbid obesity, antepartum (HCC); SVD (spontaneous vaginal delivery); Supervision of other normal pregnancy, antepartum; History of gestational hypertension; Short interval between pregnancies complicating pregnancy, antepartum; GBS bacteriuria; and Anemia in pregnancy on their problem list.  Patient reports no complaints. Reports fetal movement. Denies any contractions, bleeding or leaking of fluid.   The following portions of the patient's history were reviewed and updated as appropriate: allergies, current medications, past family history, past medical history, past social history, past surgical history and problem list.   Objective:   General:  Alert, oriented and cooperative.   Mental Status: Normal mood and affect perceived. Normal judgment and thought content.  Rest of physical exam deferred due to type of encounter  Assessment and Plan:  Pregnancy: G2P1001 at [redacted]w[redacted]d 1. Supervision of other normal pregnancy, antepartum  2. Short interval between pregnancies complicating pregnancy, antepartum  3. Anemia during pregnancy in third trimester  4. Obesity affecting pregnancy, antepartum  5. Tobacco  dependence   Preterm labor symptoms and general obstetric precautions including but not limited to vaginal bleeding, contractions, leaking of fluid and fetal movement were reviewed in detail with the patient.  I discussed the assessment and treatment plan with the patient. The patient was provided an opportunity to ask questions and all were answered. The patient agreed with the plan and demonstrated an understanding of the instructions. The patient was advised to call back or seek an in-person office evaluation/go to MAU at [facility] for any urgent or concerning symptoms. Please refer to After Visit Summary for other counseling recommendations.   I provided 10 minutes of non-face-to-face time during this encounter.  No follow-ups on file.  Future Appointments  Date Time Provider Department Center  04/10/2020 11:40 AM [facility] NURSE [facility]  04/10/2020 11:45 AM [facility] US4 [facility]    Coral Ceo, MD Center for [facility], [facility] 03/20/20

## 2020-04-03 ENCOUNTER — Encounter: Payer: Self-pay | Admitting: Obstetrics

## 2020-04-03 ENCOUNTER — Telehealth (INDEPENDENT_AMBULATORY_CARE_PROVIDER_SITE_OTHER): Payer: Medicaid Other | Admitting: Obstetrics

## 2020-04-03 DIAGNOSIS — O09893 Supervision of other high risk pregnancies, third trimester: Secondary | ICD-10-CM | POA: Diagnosis not present

## 2020-04-03 DIAGNOSIS — Z3A31 31 weeks gestation of pregnancy: Secondary | ICD-10-CM

## 2020-04-03 DIAGNOSIS — O99213 Obesity complicating pregnancy, third trimester: Secondary | ICD-10-CM | POA: Diagnosis not present

## 2020-04-03 DIAGNOSIS — O99820 Streptococcus B carrier state complicating pregnancy: Secondary | ICD-10-CM | POA: Diagnosis not present

## 2020-04-03 DIAGNOSIS — E669 Obesity, unspecified: Secondary | ICD-10-CM

## 2020-04-03 DIAGNOSIS — O99013 Anemia complicating pregnancy, third trimester: Secondary | ICD-10-CM

## 2020-04-03 DIAGNOSIS — D649 Anemia, unspecified: Secondary | ICD-10-CM

## 2020-04-03 DIAGNOSIS — O99210 Obesity complicating pregnancy, unspecified trimester: Secondary | ICD-10-CM

## 2020-04-03 DIAGNOSIS — O09899 Supervision of other high risk pregnancies, unspecified trimester: Secondary | ICD-10-CM

## 2020-04-03 DIAGNOSIS — Z3480 Encounter for supervision of other normal pregnancy, unspecified trimester: Secondary | ICD-10-CM

## 2020-04-03 DIAGNOSIS — R8271 Bacteriuria: Secondary | ICD-10-CM

## 2020-04-03 MED ORDER — VITAFOL GUMMIES 3.33-0.333-34.8 MG PO CHEW: 3.0000 | CHEWABLE_TABLET | Freq: Every day | ORAL | 11 refills | Status: DC

## 2020-04-03 NOTE — Progress Notes (Signed)
   TELEHEALTH OBSTETRICS VISIT ENCOUNTER NOTE  I connected with Taylor Reed on 04/03/20 at  9:30 AM EDT by telephone at home and verified that I am speaking with the correct person using two identifiers.   I discussed the limitations, risks, security and privacy concerns of performing an evaluation and management service by telephone and the availability of in person appointments. I also discussed with the patient that there may be a patient responsible charge related to this service. The patient expressed understanding and agreed to proceed.  Subjective:  Taylor Reed is a 24 y.o. G2P1001 at [redacted]w[redacted]d being followed for ongoing prenatal care.  She is currently monitored for the following issues for this low-risk pregnancy and has Herpes genitalia; Latex allergy; Acne; Maternal morbid obesity, antepartum (HCC); SVD (spontaneous vaginal delivery); Supervision of other normal pregnancy, antepartum; History of gestational hypertension; Short interval between pregnancies complicating pregnancy, antepartum; GBS bacteriuria; and Anemia in pregnancy on their problem list.  Patient reports no complaints. Reports fetal movement. Denies any contractions, bleeding or leaking of fluid.   The following portions of the patient's history were reviewed and updated as appropriate: allergies, current medications, past family history, past medical history, past social history, past surgical history and problem list.   Objective:   General:  Alert, oriented and cooperative.   Mental Status: Normal mood and affect perceived. Normal judgment and thought content.  Rest of physical exam deferred due to type of encounter  Assessment and Plan:  Pregnancy: G2P1001 at [redacted]w[redacted]d 1. Supervision of other normal pregnancy, antepartum  2. Anemia during pregnancy in third trimester  3. Short interval between pregnancies complicating pregnancy, antepartum  4. Obesity affecting pregnancy, antepartum  5. GBS  bacteriuria   Preterm labor symptoms and general obstetric precautions including but not limited to vaginal bleeding, contractions, leaking of fluid and fetal movement were reviewed in detail with the patient.  I discussed the assessment and treatment plan with the patient. The patient was provided an opportunity to ask questions and all were answered. The patient agreed with the plan and demonstrated an understanding of the instructions. The patient was advised to call back or seek an in-person office evaluation/go to MAU at [facility] for any urgent or concerning symptoms. Please refer to After Visit Summary for other counseling recommendations.   I provided 15 minutes of non-face-to-face time during this encounter.  Return in about 2 weeks (around 04/17/2020) for Telephone OB.  Future Appointments  Date Time Provider Department Center  04/03/2020  9:30 AM Brock Bad, MD [facility] None  04/10/2020 11:40 AM [facility] NURSE [facility]  04/10/2020 11:45 AM [facility] US4 [facility]  04/17/2020  9:55 AM Sharyon Cable, CNM [facility] None    Coral Ceo, MD Center for [facility], [facility] 04/03/20

## 2020-04-03 NOTE — Progress Notes (Signed)
Virtual ROB   CC: None  

## 2020-04-10 ENCOUNTER — Ambulatory Visit: Payer: Medicaid Other

## 2020-04-17 ENCOUNTER — Telehealth (INDEPENDENT_AMBULATORY_CARE_PROVIDER_SITE_OTHER): Payer: Medicaid Other | Admitting: Certified Nurse Midwife

## 2020-04-17 ENCOUNTER — Encounter: Payer: Self-pay | Admitting: Certified Nurse Midwife

## 2020-04-17 DIAGNOSIS — Z3A33 33 weeks gestation of pregnancy: Secondary | ICD-10-CM

## 2020-04-17 DIAGNOSIS — O99820 Streptococcus B carrier state complicating pregnancy: Secondary | ICD-10-CM

## 2020-04-17 DIAGNOSIS — Z8759 Personal history of other complications of pregnancy, childbirth and the puerperium: Secondary | ICD-10-CM

## 2020-04-17 DIAGNOSIS — A6000 Herpesviral infection of urogenital system, unspecified: Secondary | ICD-10-CM

## 2020-04-17 DIAGNOSIS — O99013 Anemia complicating pregnancy, third trimester: Secondary | ICD-10-CM

## 2020-04-17 DIAGNOSIS — R8271 Bacteriuria: Secondary | ICD-10-CM

## 2020-04-17 DIAGNOSIS — O98313 Other infections with a predominantly sexual mode of transmission complicating pregnancy, third trimester: Secondary | ICD-10-CM

## 2020-04-17 DIAGNOSIS — D649 Anemia, unspecified: Secondary | ICD-10-CM

## 2020-04-17 DIAGNOSIS — Z3480 Encounter for supervision of other normal pregnancy, unspecified trimester: Secondary | ICD-10-CM

## 2020-04-17 MED ORDER — VALACYCLOVIR HCL 500 MG PO TABS: 500.0000 mg | ORAL_TABLET | Freq: Two times a day (BID) | ORAL | 2 refills | Status: DC

## 2020-04-17 NOTE — Patient Instructions (Signed)

## 2020-04-17 NOTE — Progress Notes (Signed)
S/W pt for virtual visit. Pt reports fetal movement with occasional pressure. Pt states that she is moving and does not have BP cuff with her.

## 2020-04-17 NOTE — Progress Notes (Signed)
OBSTETRICS PRENATAL VIRTUAL VISIT ENCOUNTER NOTE  Provider location: Center for [facility] [place]   I connected with [name] on 04/17/20 at  9:31 AM EDT by MyChart Video Encounter at home and verified that I am speaking with the correct person using two identifiers.   I discussed the limitations, risks, security and privacy concerns of performing an evaluation and management service virtually and the availability of in person appointments. I also discussed with the patient that there may be a patient responsible charge related to this service. The patient expressed understanding and agreed to proceed. Subjective:  Taylor Reed is a 24 y.o. G2P1001 at [redacted]w[redacted]d being seen today for ongoing prenatal care.  She is currently monitored for the following issues for this low-risk pregnancy and has Herpes genitalia; Latex allergy; Acne; Maternal morbid obesity, antepartum (HCC); SVD (spontaneous vaginal delivery); Supervision of other normal pregnancy, antepartum; History of gestational hypertension; Short interval between pregnancies complicating pregnancy, antepartum; GBS bacteriuria; and Anemia in pregnancy on their problem list.  Patient reports no complaints.  Contractions: Irritability. Vag. Bleeding: None.  Movement: Present. Denies any leaking of fluid.   The following portions of the patient's history were reviewed and updated as appropriate: allergies, current medications, past family history, past medical history, past social history, past surgical history and problem list.   Objective:  There were no vitals filed for this visit.  Fetal Status:     Movement: Present     General:  Alert, oriented and cooperative. Patient is in no acute distress.  Respiratory: Normal respiratory effort, no problems with respiration noted  Mental Status: Normal mood and affect. Normal behavior. Normal judgment and thought content.  Rest of physical exam deferred due to type of  encounter  Imaging: No results found.  Assessment and Plan:  Pregnancy: G2P1001 at [redacted]w[redacted]d 1. Supervision of other normal pregnancy, antepartum - Patient doing well, no complaints  - patient denies contractions but reports having some pressure with walking  - routine prenatal care - anticipatory guidance on upcoming appointments  2. Anemia during pregnancy in third trimester - continue to take iron supplementation, patient daily   3. History of gestational hypertension - Patient unable to take BP today d/t currently moving  - encouraged patient to take BP and send message through mychart with reading once she is around her cuff - continue bASA  4. GBS bacteriuria - treat in labor   5. Herpes simplex infection of genitourinary system - suppression around 35 weeks  - valACYclovir (VALTREX) 500 MG tablet; Take 1 tablet (500 mg total) by mouth 2 (two) times daily.  Dispense: 60 tablet; Refill: 2   Preterm labor symptoms and general obstetric precautions including but not limited to vaginal bleeding, contractions, leaking of fluid and fetal movement were reviewed in detail with the patient. I discussed the assessment and treatment plan with the patient. The patient was provided an opportunity to ask questions and all were answered. The patient agreed with the plan and demonstrated an understanding of the instructions. The patient was advised to call back or seek an in-person office evaluation/go to MAU at [facility] for any urgent or concerning symptoms. Please refer to After Visit Summary for other counseling recommendations.   I provided 8 minutes of face-to-face time during this encounter.  Return in about 15 days (around 05/02/2020) for ROB-in person .  Future Appointments  Date Time Provider Department Center  05/02/2020 11:00 AM Brock Bad, MD [facility] None  Lajean Manes, [place] for [employer], [place]

## 2020-05-02 ENCOUNTER — Encounter: Payer: Medicaid Other | Admitting: Obstetrics

## 2020-05-03 ENCOUNTER — Encounter: Payer: Medicaid Other | Admitting: Obstetrics

## 2020-05-16 ENCOUNTER — Ambulatory Visit (INDEPENDENT_AMBULATORY_CARE_PROVIDER_SITE_OTHER): Payer: Medicaid Other | Admitting: Obstetrics

## 2020-05-16 ENCOUNTER — Other Ambulatory Visit: Payer: Self-pay

## 2020-05-16 ENCOUNTER — Other Ambulatory Visit (HOSPITAL_COMMUNITY)
Admission: RE | Admit: 2020-05-16 | Discharge: 2020-05-16 | Disposition: A | Discharge status: Home / Self Care | Payer: Medicaid Other | Source: Ambulatory Visit | Attending: Obstetrics | Admitting: Obstetrics

## 2020-05-16 ENCOUNTER — Encounter: Payer: Self-pay | Admitting: Obstetrics

## 2020-05-16 VITALS — BP 111/72 | HR 102 | Wt 250.3 lb

## 2020-05-16 DIAGNOSIS — O99210 Obesity complicating pregnancy, unspecified trimester: Secondary | ICD-10-CM

## 2020-05-16 DIAGNOSIS — Z3A38 38 weeks gestation of pregnancy: Secondary | ICD-10-CM

## 2020-05-16 DIAGNOSIS — O0990 Supervision of high risk pregnancy, unspecified, unspecified trimester: Secondary | ICD-10-CM | POA: Diagnosis not present

## 2020-05-16 DIAGNOSIS — O99013 Anemia complicating pregnancy, third trimester: Secondary | ICD-10-CM

## 2020-05-16 DIAGNOSIS — O09893 Supervision of other high risk pregnancies, third trimester: Secondary | ICD-10-CM

## 2020-05-16 DIAGNOSIS — O0993 Supervision of high risk pregnancy, unspecified, third trimester: Secondary | ICD-10-CM

## 2020-05-16 DIAGNOSIS — O09899 Supervision of other high risk pregnancies, unspecified trimester: Secondary | ICD-10-CM

## 2020-05-16 DIAGNOSIS — O99213 Obesity complicating pregnancy, third trimester: Secondary | ICD-10-CM

## 2020-05-16 DIAGNOSIS — Z8759 Personal history of other complications of pregnancy, childbirth and the puerperium: Secondary | ICD-10-CM

## 2020-05-16 NOTE — Progress Notes (Signed)
Subjective:  Taylor Reed is a 24 y.o. G2P1001 at [redacted]w[redacted]d being seen today for ongoing prenatal care.  She is currently monitored for the following issues for this high-risk pregnancy and has Herpes genitalia; Latex allergy; Acne; Maternal morbid obesity, antepartum (HCC); SVD (spontaneous vaginal delivery); Supervision of other normal pregnancy, antepartum; History of gestational hypertension; Short interval between pregnancies complicating pregnancy, antepartum; GBS bacteriuria; and Anemia in pregnancy on their problem list.  Patient reports no complaints.  Contractions: Not present. Vag. Bleeding: None.  Movement: Present. Denies leaking of fluid.   The following portions of the patient's history were reviewed and updated as appropriate: allergies, current medications, past family history, past medical history, past social history, past surgical history and problem list. Problem list updated.  Objective:   Vitals:   05/16/20 0947  BP: 111/72  Pulse: 102  Weight: (!) 250 lb 4.8 oz (113.5 kg)    Fetal Status:     Movement: Present     General:  Alert, oriented and cooperative. Patient is in no acute distress.  Skin: Skin is warm and dry. No rash noted.   Cardiovascular: Normal heart rate noted  Respiratory: Normal respiratory effort, no problems with respiration noted  Abdomen: Soft, gravid, appropriate for gestational age. Pain/Pressure: Present     Pelvic:  Cervical exam deferred        Extremities: Normal range of motion.  Edema: None  Mental Status: Normal mood and affect. Normal behavior. Normal judgment and thought content.   Urinalysis:      Assessment and Plan:  Pregnancy: G2P1001 at [redacted]w[redacted]d  1. Supervision of high risk pregnancy, antepartum Rx: - Strep Gp B NAA - Cervicovaginal ancillary only( [place])  2. Short interval between pregnancies complicating pregnancy, antepartum  3. History of gestational hypertension - clinically stable  4. Anemia during  pregnancy in third trimester  5. Obesity affecting pregnancy, antepartum   Term labor symptoms and general obstetric precautions including but not limited to vaginal bleeding, contractions, leaking of fluid and fetal movement were reviewed in detail with the patient. Please refer to After Visit Summary for other counseling recommendations.   Return in about 1 week (around 05/23/2020) for [facility].   Brock Bad, MD  05/16/20

## 2020-05-16 NOTE — Progress Notes (Signed)
Pt is here for ROB, [redacted]w[redacted]d.  

## 2020-05-17 LAB — CERVICOVAGINAL ANCILLARY ONLY
Chlamydia: NEGATIVE
Comment: NEGATIVE
Comment: NORMAL
Neisseria Gonorrhea: NEGATIVE

## 2020-05-18 LAB — STREP GP B NAA: Strep Gp B NAA: NEGATIVE

## 2020-05-19 DIAGNOSIS — O99343 Other mental disorders complicating pregnancy, third trimester: Secondary | ICD-10-CM | POA: Diagnosis not present

## 2020-05-19 DIAGNOSIS — Z8619 Personal history of other infectious and parasitic diseases: Secondary | ICD-10-CM | POA: Diagnosis not present

## 2020-05-19 DIAGNOSIS — R1030 Lower abdominal pain, unspecified: Secondary | ICD-10-CM | POA: Diagnosis not present

## 2020-05-19 DIAGNOSIS — R45851 Suicidal ideations: Secondary | ICD-10-CM | POA: Diagnosis not present

## 2020-05-19 DIAGNOSIS — Z8759 Personal history of other complications of pregnancy, childbirth and the puerperium: Secondary | ICD-10-CM | POA: Diagnosis not present

## 2020-05-19 DIAGNOSIS — Z634 Disappearance and death of family member: Secondary | ICD-10-CM | POA: Diagnosis not present

## 2020-05-19 DIAGNOSIS — F329 Major depressive disorder, single episode, unspecified: Secondary | ICD-10-CM | POA: Diagnosis not present

## 2020-05-19 DIAGNOSIS — Z3A38 38 weeks gestation of pregnancy: Secondary | ICD-10-CM | POA: Diagnosis not present

## 2020-05-20 ENCOUNTER — Ambulatory Visit: Payer: Medicaid Other

## 2020-05-20 ENCOUNTER — Ambulatory Visit: Payer: Medicaid Other | Attending: Obstetrics and Gynecology

## 2020-05-21 DIAGNOSIS — R45851 Suicidal ideations: Secondary | ICD-10-CM | POA: Diagnosis not present

## 2020-05-21 DIAGNOSIS — F332 Major depressive disorder, recurrent severe without psychotic features: Secondary | ICD-10-CM | POA: Diagnosis not present

## 2020-05-21 DIAGNOSIS — Z3A38 38 weeks gestation of pregnancy: Secondary | ICD-10-CM | POA: Diagnosis not present

## 2020-05-21 DIAGNOSIS — O99891 Other specified diseases and conditions complicating pregnancy: Secondary | ICD-10-CM | POA: Diagnosis not present

## 2020-05-21 DIAGNOSIS — O99343 Other mental disorders complicating pregnancy, third trimester: Secondary | ICD-10-CM | POA: Diagnosis not present

## 2020-05-21 DIAGNOSIS — R109 Unspecified abdominal pain: Secondary | ICD-10-CM | POA: Diagnosis not present

## 2020-05-23 ENCOUNTER — Encounter: Payer: Medicaid Other | Admitting: Obstetrics and Gynecology

## 2020-05-28 ENCOUNTER — Ambulatory Visit (INDEPENDENT_AMBULATORY_CARE_PROVIDER_SITE_OTHER): Payer: Medicaid Other | Admitting: Advanced Practice Midwife

## 2020-05-28 ENCOUNTER — Other Ambulatory Visit: Payer: Self-pay

## 2020-05-28 VITALS — BP 139/62 | HR 86 | Wt 248.0 lb

## 2020-05-28 DIAGNOSIS — Z3403 Encounter for supervision of normal first pregnancy, third trimester: Secondary | ICD-10-CM

## 2020-05-28 DIAGNOSIS — O09899 Supervision of other high risk pregnancies, unspecified trimester: Secondary | ICD-10-CM

## 2020-05-28 DIAGNOSIS — O99013 Anemia complicating pregnancy, third trimester: Secondary | ICD-10-CM

## 2020-05-28 DIAGNOSIS — A6000 Herpesviral infection of urogenital system, unspecified: Secondary | ICD-10-CM

## 2020-05-28 NOTE — Progress Notes (Signed)
   PRENATAL VISIT NOTE  Subjective:  Taylor Reed is a 24 y.o. G2P1001 at [redacted]w[redacted]d being seen today for ongoing prenatal care.  She is currently monitored for the following issues for this low-risk pregnancy and has Herpes genitalia; Latex allergy; Acne; Maternal morbid obesity, antepartum (HCC); SVD (spontaneous vaginal delivery); Supervision of other normal pregnancy, antepartum; History of gestational hypertension; Short interval between pregnancies complicating pregnancy, antepartum; GBS bacteriuria; and Anemia in pregnancy on their problem list.  Patient reports occasional contractions.  Contractions: Irritability. Vag. Bleeding: None.  Movement: Present. Denies leaking of fluid.   The following portions of the patient's history were reviewed and updated as appropriate: allergies, current medications, past family history, past medical history, past social history, past surgical history and problem list.   Objective:   Vitals:   05/28/20 1114  BP: 139/62  Pulse: 86  Weight: 248 lb (112.5 kg)    Fetal Status: Fetal Heart Rate (bpm): 132 Fundal Height: 38 cm Movement: Present  Presentation: Vertex  General:  Alert, oriented and cooperative. Patient is in no acute distress.  Skin: Skin is warm and dry. No rash noted.   Cardiovascular: Normal heart rate noted  Respiratory: Normal respiratory effort, no problems with respiration noted  Abdomen: Soft, gravid, appropriate for gestational age.  Pain/Pressure: Present     Pelvic: Cervical exam performed in the presence of a chaperone Dilation: 3 Effacement (%): 50 Station: -3  Extremities: Normal range of motion.  Edema: None  Mental Status: Normal mood and affect. Normal behavior. Normal judgment and thought content.   Assessment and Plan:  Pregnancy: G2P1001 at [redacted]w[redacted]d 1. Encounter for supervision of normal first pregnancy in third trimester --Anticipatory guidance about next visits/weeks of pregnancy given. --Next visit in 1 week in  office for NST --IOL scheduled for 41 weeks  2. Short interval between pregnancies complicating pregnancy, antepartum --SVD 04/2019  3. Anemia during pregnancy in third trimester --Taking oral iron  4. Genital herpes simplex, unspecified site --Taking Valtrex  Term labor symptoms and general obstetric precautions including but not limited to vaginal bleeding, contractions, leaking of fluid and fetal movement were reviewed in detail with the patient. Please refer to After Visit Summary for other counseling recommendations.   Return in about 1 week (around 06/04/2020).  No future appointments.  Sharen Counter, CNM

## 2020-05-28 NOTE — Patient Instructions (Signed)
Things to Try After 37 weeks to Encourage Labor/Get Ready for Labor:   1.  Try the Miles Circuit at www.milescircuit.com daily to improve baby's position and encourage the onset of labor.  2. Walk a little and rest a little every day.  Change positions often.  3. Cervical Ripening: May try one or both a. Red Raspberry Leaf capsules or tea:  two 300mg or 400mg tablets with each meal, 2-3 times a day, or 1-3 cups of tea daily  Potential Side Effects Of Raspberry Leaf:  Most women do not experience any side effects from drinking raspberry leaf tea. However, nausea and loose stools are possible   b. Evening Primrose Oil capsules: may take 1 to 3 capsules daily. Take 1-2 capsules by mouth each day and place one capsule vaginally at night.  You may also prick the vaginal capsule to release the oil prior to inserting in the vagina. Some of the potential side effects:  Upset stomach  Loose stools or diarrhea  Headaches  Nausea  4. Sex (and especially sex with orgasm) can also help the cervix ripen and encourage labor onset.  Labor Precautions Reasons to come to MAU at [facility]:  1.  Contractions are  5 minutes apart or less, each last 1 minute, these have been going on for 1-2 hours, and you cannot walk or talk during them 2.  You have a large gush of fluid, or a trickle of fluid that will not stop and you have to wear a pad 3.  You have bleeding that is bright red, heavier than spotting--like menstrual bleeding (spotting can be normal in early labor or after a check of your cervix) 4.  You do not feel the baby moving like he/she normally does 

## 2020-06-04 ENCOUNTER — Encounter: Payer: Medicaid Other | Admitting: Obstetrics

## 2020-06-05 ENCOUNTER — Other Ambulatory Visit: Payer: Self-pay | Admitting: Advanced Practice Midwife

## 2020-06-06 ENCOUNTER — Encounter (HOSPITAL_COMMUNITY): Payer: Self-pay | Admitting: Obstetrics and Gynecology

## 2020-06-06 ENCOUNTER — Other Ambulatory Visit: Payer: Self-pay | Admitting: Advanced Practice Midwife

## 2020-06-06 ENCOUNTER — Inpatient Hospital Stay (HOSPITAL_COMMUNITY): Payer: Medicaid Other | Admitting: Anesthesiology

## 2020-06-06 ENCOUNTER — Inpatient Hospital Stay (HOSPITAL_COMMUNITY)
Admission: AD | Admit: 2020-06-06 | Discharge: 2020-06-08 | DRG: 807 | Disposition: A | Discharge status: Home / Self Care | Payer: Medicaid Other | Attending: Obstetrics and Gynecology | Admitting: Obstetrics and Gynecology

## 2020-06-06 ENCOUNTER — Inpatient Hospital Stay (HOSPITAL_COMMUNITY): Payer: Medicaid Other

## 2020-06-06 ENCOUNTER — Other Ambulatory Visit: Payer: Self-pay

## 2020-06-06 DIAGNOSIS — O99214 Obesity complicating childbirth: Secondary | ICD-10-CM | POA: Diagnosis present

## 2020-06-06 DIAGNOSIS — O09899 Supervision of other high risk pregnancies, unspecified trimester: Secondary | ICD-10-CM

## 2020-06-06 DIAGNOSIS — O99824 Streptococcus B carrier state complicating childbirth: Secondary | ICD-10-CM | POA: Diagnosis present

## 2020-06-06 DIAGNOSIS — O99019 Anemia complicating pregnancy, unspecified trimester: Secondary | ICD-10-CM

## 2020-06-06 DIAGNOSIS — F17210 Nicotine dependence, cigarettes, uncomplicated: Secondary | ICD-10-CM | POA: Diagnosis present

## 2020-06-06 DIAGNOSIS — Z20822 Contact with and (suspected) exposure to covid-19: Secondary | ICD-10-CM | POA: Diagnosis present

## 2020-06-06 DIAGNOSIS — O480 Post-term pregnancy: Principal | ICD-10-CM | POA: Diagnosis present

## 2020-06-06 DIAGNOSIS — Z3480 Encounter for supervision of other normal pregnancy, unspecified trimester: Secondary | ICD-10-CM

## 2020-06-06 DIAGNOSIS — Z91040 Latex allergy status: Secondary | ICD-10-CM | POA: Diagnosis not present

## 2020-06-06 DIAGNOSIS — O134 Gestational [pregnancy-induced] hypertension without significant proteinuria, complicating childbirth: Secondary | ICD-10-CM | POA: Diagnosis not present

## 2020-06-06 DIAGNOSIS — Z3A41 41 weeks gestation of pregnancy: Secondary | ICD-10-CM

## 2020-06-06 DIAGNOSIS — O99334 Smoking (tobacco) complicating childbirth: Secondary | ICD-10-CM | POA: Diagnosis present

## 2020-06-06 LAB — TYPE AND SCREEN
ABO/RH(D): O POS
Antibody Screen: NEGATIVE

## 2020-06-06 LAB — CBC
HCT: 37.4 % (ref 36.0–46.0)
Hemoglobin: 11.2 g/dL — ABNORMAL LOW (ref 12.0–15.0)
MCH: 26.7 pg (ref 26.0–34.0)
MCHC: 29.9 g/dL — ABNORMAL LOW (ref 30.0–36.0)
MCV: 89.3 fL (ref 80.0–100.0)
Platelets: 280 10*3/uL (ref 150–400)
RBC: 4.19 MIL/uL (ref 3.87–5.11)
RDW: 18.6 % — ABNORMAL HIGH (ref 11.5–15.5)
WBC: 8.3 10*3/uL (ref 4.0–10.5)
nRBC: 0 % (ref 0.0–0.2)

## 2020-06-06 LAB — SARS CORONAVIRUS 2 (TAT 6-24 HRS): SARS Coronavirus 2: NEGATIVE

## 2020-06-06 MED ORDER — MISOPROSTOL 25 MCG QUARTER TABLET
25.0000 ug | ORAL_TABLET | ORAL | Status: DC | PRN
  Administered 2020-06-06: 25 ug via VAGINAL
  Filled 2020-06-06: qty 1

## 2020-06-06 MED ORDER — SOD CITRATE-CITRIC ACID 500-334 MG/5ML PO SOLN: 30.0000 mL | ORAL | Status: DC | PRN

## 2020-06-06 MED ORDER — FENTANYL CITRATE (PF) 2500 MCG/50ML IJ SOLN
INTRAMUSCULAR | Status: DC | PRN
Start: 2020-06-06 — End: 2020-06-06
  Administered 2020-06-06: 12 mL/h via EPIDURAL

## 2020-06-06 MED ORDER — OXYCODONE-ACETAMINOPHEN 5-325 MG PO TABS: 2.0000 | ORAL_TABLET | ORAL | Status: DC | PRN

## 2020-06-06 MED ORDER — OXYTOCIN BOLUS FROM INFUSION
333.0000 mL | Freq: Once | INTRAVENOUS | Status: AC
  Administered 2020-06-06: 333 mL via INTRAVENOUS

## 2020-06-06 MED ORDER — OXYTOCIN-SODIUM CHLORIDE 30-0.9 UT/500ML-% IV SOLN
2.5000 [IU]/h | INTRAVENOUS | Status: DC
  Filled 2020-06-06: qty 500

## 2020-06-06 MED ORDER — EPHEDRINE 5 MG/ML INJ: 10.0000 mg | INTRAVENOUS | Status: DC | PRN

## 2020-06-06 MED ORDER — PHENYLEPHRINE 40 MCG/ML (10ML) SYRINGE FOR IV PUSH (FOR BLOOD PRESSURE SUPPORT)
80.0000 ug | PREFILLED_SYRINGE | INTRAVENOUS | Status: DC | PRN
  Filled 2020-06-06: qty 10

## 2020-06-06 MED ORDER — DIPHENHYDRAMINE HCL 50 MG/ML IJ SOLN: 12.5000 mg | INTRAMUSCULAR | Status: DC | PRN

## 2020-06-06 MED ORDER — SODIUM CHLORIDE 0.9 % IV SOLN
5.0000 10*6.[IU] | Freq: Once | INTRAVENOUS | Status: AC
  Administered 2020-06-06: 5 10*6.[IU] via INTRAVENOUS
  Filled 2020-06-06: qty 5

## 2020-06-06 MED ORDER — LIDOCAINE HCL (PF) 1 % IJ SOLN: 30.0000 mL | INTRAMUSCULAR | Status: DC | PRN

## 2020-06-06 MED ORDER — PENICILLIN G POT IN DEXTROSE 60000 UNIT/ML IV SOLN
3.0000 10*6.[IU] | INTRAVENOUS | Status: DC
  Administered 2020-06-06 (×2): 3 10*6.[IU] via INTRAVENOUS
  Filled 2020-06-06 (×4): qty 50

## 2020-06-06 MED ORDER — OXYTOCIN-SODIUM CHLORIDE 30-0.9 UT/500ML-% IV SOLN
1.0000 m[IU]/min | INTRAVENOUS | Status: DC
  Administered 2020-06-06: 2 m[IU]/min via INTRAVENOUS

## 2020-06-06 MED ORDER — LACTATED RINGERS IV SOLN
500.0000 mL | Freq: Once | INTRAVENOUS | Status: AC
  Administered 2020-06-06: 500 mL via INTRAVENOUS

## 2020-06-06 MED ORDER — TERBUTALINE SULFATE 1 MG/ML IJ SOLN: 0.2500 mg | Freq: Once | INTRAMUSCULAR | Status: DC | PRN

## 2020-06-06 MED ORDER — FENTANYL CITRATE (PF) 100 MCG/2ML IJ SOLN: 100.0000 ug | INTRAMUSCULAR | Status: DC | PRN

## 2020-06-06 MED ORDER — ACETAMINOPHEN 325 MG PO TABS: 650.0000 mg | ORAL_TABLET | ORAL | Status: DC | PRN

## 2020-06-06 MED ORDER — ONDANSETRON HCL 4 MG/2ML IJ SOLN: 4.0000 mg | Freq: Four times a day (QID) | INTRAMUSCULAR | Status: DC | PRN

## 2020-06-06 MED ORDER — PHENYLEPHRINE 40 MCG/ML (10ML) SYRINGE FOR IV PUSH (FOR BLOOD PRESSURE SUPPORT)
80.0000 ug | PREFILLED_SYRINGE | INTRAVENOUS | Status: AC | PRN
  Administered 2020-06-06 (×3): 80 ug via INTRAVENOUS

## 2020-06-06 MED ORDER — FENTANYL-BUPIVACAINE-NACL 0.5-0.125-0.9 MG/250ML-% EP SOLN
12.0000 mL/h | EPIDURAL | Status: DC | PRN
  Filled 2020-06-06: qty 250

## 2020-06-06 MED ORDER — OXYCODONE-ACETAMINOPHEN 5-325 MG PO TABS: 1.0000 | ORAL_TABLET | ORAL | Status: DC | PRN

## 2020-06-06 MED ORDER — LACTATED RINGERS IV SOLN: 500.0000 mL | INTRAVENOUS | Status: DC | PRN

## 2020-06-06 MED ORDER — LIDOCAINE HCL (PF) 1 % IJ SOLN
INTRAMUSCULAR | Status: DC | PRN
  Administered 2020-06-06: 10 mL via EPIDURAL
  Administered 2020-06-06: 2 mL via EPIDURAL

## 2020-06-06 MED ORDER — LACTATED RINGERS IV SOLN: INTRAVENOUS | Status: DC

## 2020-06-06 NOTE — Progress Notes (Signed)
Labor Progress Note Taylor Reed is a 24 y.o. G2P1001 at [redacted]w[redacted]d presented for IOL for postdates.  S: Doing well overall, pain fairly well-controlled with epidural.  O:  BP 111/61   Pulse 88   Temp 97.8 F (36.6 C) (Oral)   Resp 18   LMP 08/24/2019   SpO2 99%  EFM: 135/mod variability/+ accels/occ variable decels  CVE: Dilation: 6 Effacement (%): 80 Cervical Position: Middle Station: -2 Presentation: Vertex Exam by:: Dr. Maryagnes Amos   A&P: 24 y.o. G2P1001 [redacted]w[redacted]d presenting for IOL for PD.  #Labor: Progressing well. AROM at 2124 with light meconium. Pitocin stopped at 1813 for variable decels, will monitor strip  #Pain: Epidural #FWB: Cat II strip, reassuring due to good recovery back to baseline and moderate variability #GBS positive on PCN   Taylor Kayser, MD 9:53 PM

## 2020-06-06 NOTE — Anesthesia Preprocedure Evaluation (Signed)
Anesthesia Evaluation  Patient identified by MRN, date of birth, ID band Patient awake    Reviewed: Allergy & Precautions, Patient's Chart, lab work & pertinent test results  Airway Mallampati: II  TM Distance: >3 FB Neck ROM: Full    Dental no notable dental hx.    Pulmonary Current Smoker,    Pulmonary exam normal breath sounds clear to auscultation       Cardiovascular hypertension (gestational HTN), Normal cardiovascular exam Rhythm:Regular Rate:Normal     Neuro/Psych PSYCHIATRIC DISORDERS Depression negative neurological ROS     GI/Hepatic negative GI ROS, Neg liver ROS,   Endo/Other  Morbid obesity  Renal/GU negative Renal ROS  negative genitourinary   Musculoskeletal negative musculoskeletal ROS (+)   Abdominal (+) + obese,   Peds  Hematology hct 37.4, plt 280   Anesthesia Other Findings   Reproductive/Obstetrics (+) Pregnancy                             Anesthesia Physical Anesthesia Plan  ASA: III and emergent  Anesthesia Plan: Epidural   Post-op Pain Management:    Induction:   PONV Risk Score and Plan: 2  Airway Management Planned: Natural Airway  Additional Equipment: None  Intra-op Plan:   Post-operative Plan:   Informed Consent: I have reviewed the patients History and Physical, chart, labs and discussed the procedure including the risks, benefits and alternatives for the proposed anesthesia with the patient or authorized representative who has indicated his/her understanding and acceptance.       Plan Discussed with:   Anesthesia Plan Comments:         Anesthesia Quick Evaluation

## 2020-06-06 NOTE — Progress Notes (Signed)
Labor Progress Note Taylor Reed is a 24 y.o. G2P1001 at [redacted]w[redacted]d presented for IOL for PD.  S: only a small increase in discomfort.  Overall feels well.  Comfortable in bed for now.   No HA or vision changes.  O:  BP 127/60   Pulse 87   Temp 98.8 F (37.1 C) (Oral)   Resp 18   LMP 08/24/2019  EFM: 130/moderate var/positive accels, no decels.  CVE: Dilation: 4 Effacement (%): 60 Cervical Position: Middle Station: -3 Presentation: Vertex Exam by:: Cher Nakai RNC   A&P: 24 y.o. G2P1001 [redacted]w[redacted]d IOL for PD.  #Labor: Progressing well after one cytotec.  Will start Pit now.  #Pain: IV PRN for now. Trying to avoid epidural. #FWB: Cat I  #GBS pos on PCN.  Mirian Mo, MD 2:19 PM

## 2020-06-06 NOTE — Progress Notes (Signed)
17:04 Went to beside to resolve fetal heart rate tracing. Patient laying in bed flat on back with eyes closed. Assisted patient into a left side lying tilt.

## 2020-06-06 NOTE — Discharge Summary (Signed)
Postpartum Discharge Summary     Patient Name: Taylor Reed DOB: Jan 15, 1996 MRN: [id number]  Date of admission: 06/06/2020 Delivery date:06/06/2020  Delivering provider: Wende Mott A  Date of discharge: 06/08/2020  Admitting diagnosis: Post term pregnancy at [redacted] weeks gestation [O48.0, Z3A.41] Intrauterine pregnancy: [redacted]w[redacted]d    Secondary diagnosis:  Active Problems:   Post term pregnancy at 410weeks gestation   Additional problems: none    Discharge diagnosis: Term Pregnancy Delivered                                              Post partum procedures:none Augmentation: AROM, Pitocin and Cytotec Complications: None  Hospital course: Induction of Labor With Vaginal Delivery   24y.o. yo G2P1001 at 456w0das admitted to the hospital 06/06/2020 for induction of labor.  Indication for induction: Postdates.  Patient had an uncomplicated labor course as follows: received a cytotec and started on Pitocin.  Pitocin dc'd around 1830 d/t some variables. Membrane Rupture Time/Date: 9:24 PM ,06/06/2020  pt progressed rapidly to C/C/+3.  5 minute 2nd stage. Delivery Method:Vaginal, Spontaneous  Episiotomy: None  Lacerations:  None  Details of delivery can be found in separate delivery note.  Patient had a routine postpartum course. Patient is discharged home 06/08/20.  Newborn Data: Birth date:06/06/2020  Birth time:10:11 PM  Gender:Female  Living status:Living  Apgars:9 ,8  Weight:2869 g   Magnesium Sulfate received: No BMZ received: No Rhophylac:N/A MMR:N/A T-DaP:Given prenatally Flu: N/A Transfusion:No  Physical exam  Vitals:   06/07/20 0857 06/07/20 1339 06/07/20 2204 06/08/20 0542  BP: (!) 102/59 (!) 117/46 (!) 108/56 114/82  Pulse: 79 74 68 81  Resp: _0 Temp: 98.2 F (36.8 C) 98.2 F (36.8 C) 98.3 F (36.8 C) 98.2 F (36.8 C)  TempSrc: Oral Oral Oral Oral  SpO2: 100% 100% 100% 98%   General: alert and cooperative Lochia: appropriate Uterine  Fundus: firm Incision: N/A DVT Evaluation: No evidence of DVT seen on physical exam. Labs: Lab Results  Component Value Date   WBC 8.3 06/06/2020   HGB 11.2 (L) 06/06/2020   HCT 37.4 06/06/2020   MCV 89.3 06/06/2020   PLT 280 06/06/2020   CMP Latest Ref Rng & Units 11/15/2019  Glucose 65 - 99 mg/dL 83  BUN 6 - 20 mg/dL 8  Creatinine 0.57 - 1.00 mg/dL 0.70  Sodium 134 - 144 mmol/L 138  Potassium 3.5 - 5.2 mmol/L 4.6  Chloride 96 - 106 mmol/L 104  CO2 20 - 29 mmol/L 20  Calcium 8.7 - 10.2 mg/dL 9.7  Total Protein 6.0 - 8.5 g/dL 6.5  Total Bilirubin 0.0 - 1.2 mg/dL <0.2  Alkaline Phos 39 - 117 IU/L 59  AST 0 - 40 IU/L 15  ALT 0 - 32 IU/L 11   Edinburgh Score: Edinburgh Postnatal Depression Scale Screening Tool 06/07/2020  I have been able to laugh and see the funny side of things. 0  I have looked forward with enjoyment to things. 0  I have blamed myself unnecessarily when things went wrong. 0  I have been anxious or worried for no good reason. 0  I have felt scared or panicky for no good reason. 0  Things have been getting on top of me. 0  I have been so unhappy that I have had difficulty sleeping. 0  I have felt sad or miserable. 0  I have been so unhappy that I have been crying. 0  The thought of harming myself has occurred to me. 0  Edinburgh Postnatal Depression Scale Total 0     After visit meds:  Allergies as of 06/08/2020      Reactions   Latex Rash      Medication List    STOP taking these medications   acetaminophen 325 MG tablet Commonly known as: TYLENOL   aspirin EC 81 MG tablet   cyclobenzaprine 10 MG tablet Commonly known as: FLEXERIL   ferrous sulfate 325 (65 FE) MG tablet   valACYclovir 500 MG tablet Commonly known as: VALTREX     TAKE these medications   ibuprofen 600 MG tablet Commonly known as: ADVIL Take 1 tablet (600 mg total) by mouth every 6 (six) hours as needed.   Prenatal Gummies/DHA & FA 0.4-32.5 MG Chew Chew 3 tablets by  mouth daily.        Discharge home in stable condition Infant Feeding: Bottle Infant Disposition:home with mother Discharge instruction: per After Visit Summary and Postpartum booklet. Activity: Advance as tolerated. Pelvic rest for 6 weeks.  Diet: routine diet Future Appointments: Future Appointments  Date Time Provider [place]  07/09/2020 11:00 AM Anyanwu, Sallyanne Havers, MD [facility] None   Follow up Visit:   Please schedule this patient for a Virtual postpartum visit in 4 weeks with the following provider: Any provider. Additional Postpartum F/U:  Low risk pregnancy complicated by:  Delivery mode:  Vaginal, Spontaneous  Anticipated Birth Control:  Depo given in hospital> yes   06/08/2020 Myrtis Ser, CNM  9:17 AM

## 2020-06-06 NOTE — H&P (Addendum)
OBSTETRIC ADMISSION HISTORY AND PHYSICAL  Taylor Reed is a 24 y.o. female G2P1001 with IUP at [redacted]w[redacted]d by LMP presenting for IOL for PD. She reports +FMs, No LOF, no VB, no blurry vision, headaches or peripheral edema, and RUQ pain.  She plans on bottle feeding. She request Depo for birth control. She received her prenatal care at [facility]   Dating: By LMP --->  Estimated Date of Delivery: 05/30/20  Sono:  @[redacted]w[redacted]d , CWD, normal anatomy, cephalic presentation,  1165g, 14% EFW  Prenatal History/Complications: Obesity GBS bacterurea Hx of gHTN Tobacco use anemia  Past Medical History: Past Medical History:  Diagnosis Date  . Depression   . DUB (dysfunctional uterine bleeding)   . Encounter for supervision of normal first pregnancy in third trimester 12/01/2018    Nursing Staff Provider Office Location  [place] Dating  LMP c/w 32 w sono Language  English  Anatomy 12   incomplete- f/u scheduled-Nml Flu Vaccine   declined 12/01/18 Genetic Screen  NIPS: low risks  AFP:  neg  TDaP vaccine   declined 03/01/19 Hgb A1C or  GTT Early  Third trimester: Nml Rhogam  NA   LAB RESULTS  Feeding Plan  Both Blood Type O/Positive/-- (02/13 1530)  Contraception  Depo Antibody   . GC (gonococcus infection) 07/09/2014  . Gestational hypertension 04/05/2019   Diagnosed 6/17  . Gestational hypertension, third trimester 05/03/2019  . Seasonal allergies   . UTI (urinary tract infection) 07/09/2014    Past Surgical History: Past Surgical History:  Procedure Laterality Date  . NO PAST SURGERIES      Obstetrical History: OB History    Gravida  2   Para  1   Term  1   Preterm      AB      Living  1     SAB      TAB      Ectopic      Multiple  0   Live Births  1           Social History: Social History   Socioeconomic History  . Marital status: Single    Spouse name: Not on file  . Number of children: Not on file  . Years of education: Not on file  . Highest education level: Not on  file  Occupational History  . Not on file  Tobacco Use  . Smoking status: Current Every Day Smoker    Types: Cigarettes  . Smokeless tobacco: Never Used  . Tobacco comment: none since dec 2019  Vaping Use  . Vaping Use: Never used  Substance and Sexual Activity  . Alcohol use: Not Currently  . Drug use: Not Currently  . Sexual activity: Yes  Other Topics Concern  . Not on file  Social History Narrative  . Not on file   Social Determinants of Health   Financial Resource Strain:   . Difficulty of Paying Living Expenses: Not on file  Food Insecurity:   . Worried About Jan 2020 in the Last Year: Not on file  . Ran Out of Food in the Last Year: Not on file  Transportation Needs:   . Lack of Transportation (Medical): Not on file  . Lack of Transportation (Non-Medical): Not on file  Physical Activity:   . Days of Exercise per Week: Not on file  . Minutes of Exercise per Session: Not on file  Stress:   . Feeling of Stress : Not on file  Social Connections:   .  Frequency of Communication with Friends and Family: Not on file  . Frequency of Social Gatherings with Friends and Family: Not on file  . Attends Religious Services: Not on file  . Active Member of Clubs or Organizations: Not on file  . Attends Banker Meetings: Not on file  . Marital Status: Not on file    Family History: Family History  Problem Relation Age of Onset  . Cancer Mother     Allergies: Allergies  Allergen Reactions  . Latex Rash    Medications Prior to Admission  Medication Sig Dispense Refill Last Dose  . aspirin EC 81 MG tablet Take 1 tablet (81 mg total) by mouth daily. Take after 12 weeks for prevention of preeclampsia later in pregnancy 300 tablet 0 Past Week at Unknown time  . Prenatal MV-Min-FA-Omega-3 (PRENATAL GUMMIES/DHA & FA) 0.4-32.5 MG CHEW Chew 3 tablets by mouth daily. 90 tablet 3 Past Week at Unknown time  . cyclobenzaprine (FLEXERIL) 10 MG tablet Take 1  tablet (10 mg total) by mouth 3 (three) times daily as needed. (Patient not taking: Reported on 03/13/2020) 30 tablet 0   . ferrous sulfate 325 (65 FE) MG tablet Take 1 tablet (325 mg total) by mouth daily. (Patient not taking: Reported on 03/13/2020) 30 tablet 3   . Prenatal MV-Min-FA-Omega-3 (PRENATAL GUMMIES/DHA & FA) 0.4-32.5 MG CHEW Chew 3 tablets by mouth daily. 90 tablet 4   . valACYclovir (VALTREX) 500 MG tablet Take 1 tablet (500 mg total) by mouth 2 (two) times daily. (Patient not taking: Reported on 05/16/2020) 60 tablet 2      Review of Systems   All systems reviewed and negative except as stated in HPI  Blood pressure 117/72, pulse 98, temperature 98.6 F (37 C), temperature source Oral, resp. rate 18, last menstrual period 08/24/2019, unknown if currently breastfeeding. General appearance: alert, cooperative and appears stated age Heart: regular rate and rhythm Abdomen: soft, non-tender; bowel sounds normal Extremities: no sign of DVT Fetal monitoringBaseline: 130 bpm, Variability: Good {> 6 bpm), Accelerations: Reactive and Decelerations: Absent Dilation: 3 Effacement (%): 50 Station: -3 Exam by:: Cher Nakai RNC   Prenatal labs: ABO, Rh: --/--/O POS (08/19 3716) Antibody: NEG (08/19 0850) Rubella: 2.12 (01/27 1344) RPR: Non Reactive (05/19 1052)  HBsAg: Negative (01/27 1344)  HIV: Non Reactive (05/19 1052)  GBS: Negative/-- (07/29 1021)  1 hr Glucola WNL Genetic screening  Low risk Anatomy US Normal   Prenatal Transfer Tool  Maternal Diabetes: No Genetic Screening: Normal Maternal Ultrasounds/Referrals: Normal Fetal Ultrasounds or other Referrals:  None Maternal Substance Abuse:  Yes:  Type: Smoker Significant Maternal Medications:  None Significant Maternal Lab Results: Group B Strep positive  Results for orders placed or performed during the hospital encounter of 06/06/20 (from the past 24 hour(s))  Type and screen   Collection Time: 06/06/20  8:50 AM   Result Value Ref Range   ABO/RH(D) O POS    Antibody Screen NEG    Sample Expiration      06/09/2020,2359 Performed at [facility], [address], [place], [place] [address]   CBC   Collection Time: 06/06/20  9:00 AM  Result Value Ref Range   WBC 8.3 4.0 - 10.5 K/uL   RBC 4.19 3.87 - 5.11 MIL/uL   Hemoglobin 11.2 (L) 12.0 - 15.0 g/dL   HCT 38.1 36 - 46 %   MCV 89.3 80.0 - 100.0 fL   MCH 26.7 26.0 - 34.0 pg  MCHC 29.9 (L) 30.0 - 36.0 g/dL   RDW 91.7 (H) 91.5 - 05.6 %   Platelets 280 150 - 400 K/uL   nRBC 0.0 0.0 - 0.2 %    Patient Active Problem List   Diagnosis Date Noted  . Post term pregnancy at [redacted] weeks gestation 06/06/2020  . Anemia in pregnancy 03/07/2020  . GBS bacteriuria 11/22/2019  . Short interval between pregnancies complicating pregnancy, antepartum 11/16/2019  . Supervision of other normal pregnancy, antepartum 11/15/2019  . History of gestational hypertension 11/15/2019  . SVD (spontaneous vaginal delivery) 05/04/2019  . Maternal morbid obesity, antepartum (HCC) 12/01/2018  . 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 Acne 07/02/2015  . Herpes genitalia 11/08/2014    Assessment/Plan:  Taylor Reed is a 25 y.o. G2P1001 at [redacted]w[redacted]d here for IOL for PD.  #Labor: Will start with buccal cytotec for now.  Will consider FB or Pit at next check as appropriate.   #Pain: IV PRN for now. Epi upon request. #FWB: Cat I #ID:  GBS pos on PCN #MOF: Bottle #MOC:depo #Circ:  N/A  Mirian Mo, MD  06/06/2020, 10:40 AM  Attestation of Supervision of Student:  I confirm that I have verified the information documented in the resident's note and that I have also personally coordinated the history, physical exam and all medical decision making activities.  I have verified that all services and findings are accurately documented in this student's note; and I agree with management and plan as outlined in the documentation. I have also made any necessary editorial  changes.   Calvert Cantor, CNM [facility] [employer], [facility] 06/06/2020 11:36 AM

## 2020-06-06 NOTE — Anesthesia Procedure Notes (Signed)
Epidural Patient location during procedure: OB Start time: 06/06/2020 5:44 PM End time: 06/06/2020 5:51 PM  Staffing Anesthesiologist: Lannie Fields, DO Performed: anesthesiologist   Preanesthetic Checklist Completed: patient identified, IV checked, risks and benefits discussed, monitors and equipment checked, pre-op evaluation and timeout performed  Epidural Patient position: sitting Prep: DuraPrep and site prepped and draped Patient monitoring: continuous pulse ox, blood pressure, heart rate and cardiac monitor Approach: midline Location: L3-L4 Injection technique: LOR air  Needle:  Needle type: Tuohy  Needle gauge: 17 G Needle length: 9 cm Needle insertion depth: 7 cm Catheter type: closed end flexible Catheter size: 19 Gauge Catheter at skin depth: 12 cm Test dose: negative  Assessment Sensory level: T8 Events: blood not aspirated, injection not painful, no injection resistance, no paresthesia and negative IV test  Additional Notes Patient identified. Risks/Benefits/Options discussed with patient including but not limited to bleeding, infection, nerve damage, paralysis, failed block, incomplete pain control, headache, blood pressure changes, nausea, vomiting, reactions to medication both or allergic, itching and postpartum back pain. Confirmed with bedside nurse the patient's most recent platelet count. Confirmed with patient that they are not currently taking any anticoagulation, have any bleeding history or any family history of bleeding disorders. Patient expressed understanding and wished to proceed. All questions were answered. Sterile technique was used throughout the entire procedure. Please see nursing notes for vital signs. Test dose was given through epidural catheter and negative prior to continuing to dose epidural or start infusion. Warning signs of high block given to the patient including shortness of breath, tingling/numbness in hands, complete motor  block, or any concerning symptoms with instructions to call for help. Patient was given instructions on fall risk and not to get out of bed. All questions and concerns addressed with instructions to call with any issues or inadequate analgesia.  Reason for block:procedure for pain

## 2020-06-06 NOTE — Progress Notes (Signed)
Labor Progress Note Taylor Reed is a 24 y.o. G2P1001 at [redacted]w[redacted]d presented for IOL for PD.  S: Overall feels well. Good epidural. Does not feel contractions.no HA or vision changes.  O:  BP 101/85    Pulse 82    Temp 98.8 F (37.1 C) (Oral)    Resp 18    LMP 08/24/2019    SpO2 100%  EFM: 140/moderate var/few accels, variable decels.  CVE: Dilation: 5 Effacement (%): 70 Cervical Position: Middle Station: -2 Presentation: Vertex Exam by:: Mirian Mo   A&P: 24 y.o. G2P1001 [redacted]w[redacted]d IOL for PD.  #Labor: Progressing well. Continue pit for now.  Cat II tracing, cut off pit, phenylephrine given x2. Small LR bolus given.  Good recover of strip.  Will continue to monitor and reinitiate Pit in 1 hour. #Reed: Epidural in place. #FWB: Cat II, overall reassuring.  Multiple variables which improved once Pit was cut off.  No good baseline and good variability.  Will continue to monitor. #GBS pos on PCN.  Mirian Mo, MD 6:34 PM

## 2020-06-07 ENCOUNTER — Other Ambulatory Visit: Payer: Self-pay

## 2020-06-07 MED ORDER — DIBUCAINE (PERIANAL) 1 % EX OINT: 1.0000 "application " | TOPICAL_OINTMENT | CUTANEOUS | Status: DC | PRN

## 2020-06-07 MED ORDER — COCONUT OIL OIL: 1.0000 "application " | TOPICAL_OIL | Status: DC | PRN

## 2020-06-07 MED ORDER — BENZOCAINE-MENTHOL 20-0.5 % EX AERO: 1.0000 "application " | INHALATION_SPRAY | CUTANEOUS | Status: DC | PRN

## 2020-06-07 MED ORDER — TETANUS-DIPHTH-ACELL PERTUSSIS 5-2.5-18.5 LF-MCG/0.5 IM SUSP: 0.5000 mL | Freq: Once | INTRAMUSCULAR | Status: DC

## 2020-06-07 MED ORDER — ONDANSETRON HCL 4 MG PO TABS: 4.0000 mg | ORAL_TABLET | ORAL | Status: DC | PRN

## 2020-06-07 MED ORDER — WITCH HAZEL-GLYCERIN EX PADS: 1.0000 "application " | MEDICATED_PAD | CUTANEOUS | Status: DC | PRN

## 2020-06-07 MED ORDER — IBUPROFEN 600 MG PO TABS
600.0000 mg | ORAL_TABLET | Freq: Four times a day (QID) | ORAL | Status: DC
  Administered 2020-06-07 – 2020-06-08 (×7): 600 mg via ORAL
  Filled 2020-06-07 (×7): qty 1

## 2020-06-07 MED ORDER — SIMETHICONE 80 MG PO CHEW: 80.0000 mg | CHEWABLE_TABLET | ORAL | Status: DC | PRN

## 2020-06-07 MED ORDER — ACETAMINOPHEN 325 MG PO TABS: 650.0000 mg | ORAL_TABLET | ORAL | Status: DC | PRN

## 2020-06-07 MED ORDER — SENNOSIDES-DOCUSATE SODIUM 8.6-50 MG PO TABS
2.0000 | ORAL_TABLET | ORAL | Status: DC
  Administered 2020-06-07: 2 via ORAL
  Filled 2020-06-07: qty 2

## 2020-06-07 MED ORDER — ONDANSETRON HCL 4 MG/2ML IJ SOLN: 4.0000 mg | INTRAMUSCULAR | Status: DC | PRN

## 2020-06-07 MED ORDER — PRENATAL MULTIVITAMIN CH
1.0000 | ORAL_TABLET | Freq: Every day | ORAL | Status: DC
  Administered 2020-06-07 – 2020-06-08 (×2): 1 via ORAL
  Filled 2020-06-07 (×2): qty 1

## 2020-06-07 MED ORDER — DIPHENHYDRAMINE HCL 25 MG PO CAPS: 25.0000 mg | ORAL_CAPSULE | Freq: Four times a day (QID) | ORAL | Status: DC | PRN

## 2020-06-07 NOTE — Progress Notes (Signed)
CSW attempted to speak with [name] once more at bedside, however [name] awake but FOB asleep. CSW advised [name] that CSW would have CSW on the weekend follow up with her for further needs-[name] agreeable and thanked CSW for trying.   CSW leaving handoff for weekend CSW.   Claude Manges Deveney Bayon, MSW, LCSW [facility] [place] [phone]

## 2020-06-07 NOTE — Progress Notes (Signed)
CSW acknowledges consult for social issues. CSw attempted to meet with [name] twice with both times, FOB being in room asleep or in the restroom. CSW advised [name] that CSW would like to speak with her alone as well as [name] reported wanting to speak with CSW alone. CSW to try back later once Fob is no longer in the room.     Taylor Reed S. Savaughn Karwowski, MSW, LCSW [facility] [place] [phone]   

## 2020-06-07 NOTE — Anesthesia Postprocedure Evaluation (Signed)
Anesthesia Post Note  Patient: Taylor Reed  Procedure(s) Performed: AN AD HOC LABOR EPIDURAL     Patient location during evaluation: Mother Baby Anesthesia Type: Epidural Level of consciousness: awake and alert Pain management: pain level controlled Vital Signs Assessment: post-procedure vital signs reviewed and stable Respiratory status: spontaneous breathing, nonlabored ventilation and respiratory function stable Cardiovascular status: stable Postop Assessment: no headache, no backache and epidural receding Anesthetic complications: no   No complications documented.  Last Vitals:  Vitals:   06/07/20 0530 06/07/20 0857  BP: (!) 97/46 (!) 102/59  Pulse: 65 79  Resp: 20 18  Temp: 36.6 C 36.8 C  SpO2: 100% 100%    Last Pain:  Vitals:   06/07/20 0857  TempSrc: Oral  PainSc: 0-No pain   Pain Goal:                   Mahkai Fangman

## 2020-06-07 NOTE — Progress Notes (Signed)
CSW tried to meet with [name] once more at bedside, however FOB still at bedside and [name] now asleep. CSW to try back once [name] is awake and will have FOB leave room if present.     Claude Manges Karon Heckendorn, MSW, LCSW [facility] [place] [phone]

## 2020-06-07 NOTE — Progress Notes (Signed)
Post Partum Day 1 Subjective: no complaints, up ad lib, voiding and tolerating PO, small lochia, plans to bottle feed, Depo-Provera  Objective: Blood pressure (!) 97/46, pulse 65, temperature 97.9 F (36.6 C), temperature source Oral, resp. rate 20, last menstrual period 08/24/2019, SpO2 100 %, unknown if currently breastfeeding.  Physical Exam:  General: alert, cooperative and no distress Lochia:normal flow Chest: CTAB Heart: RRR no m/r/g Abdomen: +BS, soft, nontender,  Uterine Fundus: firm DVT Evaluation: No evidence of DVT seen on physical exam. Extremities: trace edema  Recent Labs    06/06/20 0900  HGB 11.2*  HCT 37.4    Assessment/Plan: Plan for discharge tomorrow   LOS: 1 day   Jacklyn Shell 06/07/2020, 7:49 AM

## 2020-06-08 MED ORDER — IBUPROFEN 600 MG PO TABS: 600.0000 mg | ORAL_TABLET | Freq: Four times a day (QID) | ORAL | 0 refills | Status: AC | PRN

## 2020-06-08 MED ORDER — MEDROXYPROGESTERONE ACETATE 150 MG/ML IM SUSP
150.0000 mg | Freq: Once | INTRAMUSCULAR | Status: AC
  Administered 2020-06-08: 150 mg via INTRAMUSCULAR
  Filled 2020-06-08: qty 1

## 2020-06-08 NOTE — Discharge Instructions (Signed)

## 2020-06-09 LAB — RPR: RPR Ser Ql: NONREACTIVE — AB

## 2020-06-10 ENCOUNTER — Telehealth: Payer: Self-pay | Admitting: *Deleted

## 2020-06-10 NOTE — Addendum Note (Signed)
Addended by: Redmond Baseman on: 06/10/2020 02:32 PM   Modules accepted: Orders

## 2020-06-10 NOTE — Telephone Encounter (Addendum)
Patient called back. Transition of care assessment completed.  Transition Care Management Follow-up Telephone Call  . Medicaid Managed Care Transition Call Status:MM [facility] Call Made  . Date of discharge and from where:[facility], 06/08/20  . How have you been since you were released from the hospital? "feeling fine"  . Any questions or concerns? No  Items Reviewed: Marland Kitchen Did the pt receive and understand the discharge instructions provided? Yes  . Medications obtained and verified? No Patient states she is going to pick up medications today 8//23/21 . Any new allergies since your discharge? No  . Dietary orders reviewed?  Yes . Do you have support at home? Yes  Functional Questionnaire: (I = Independent and D = Dependent)  ADLs: Independent Bathing/Dressing:Independent Meal Prep: Independent Eating: Independent Maintaining continence: Independent Transferring/Ambulation: Independent Managing Meds: Independent    Follow up appointments reviewed:  PCP Hospital f/u appt confirmed? No , Patient would like to be assigned a PCP  Specialist Hospital f/u appt confirmed?  Scheduled to see Dr Philmore Pali on 07/09/20 @ 1100.  Are transportation arrangements needed? Yes , Patient states she uses Medicaid trasnportation  If their condition worsens, is the pt aware to call PCP or go to the EmergencyDept.? Yes Was the patient provided with contact information for the PCP's office or ED? Yes  Was to pt encouraged to call back with questions or concerns? Yes  Orders placed for Ambulatory Connected Care due to transportation needs.  Burnard Bunting, RN, BSN, CCRN [facility] [phone]

## 2020-06-10 NOTE — Telephone Encounter (Signed)
Attempted to call patient to schedule for new patient appointment ,no answer left message.                                         Arlyss Weathersby                                                 PEC                                        [phone]

## 2020-06-10 NOTE — Telephone Encounter (Signed)
Medicaid Managed Care team Transition of Care Assessment outreach attempt #1 made today. Unable to reach patient. HIPPA compliant voice message left requesting a return call. The patient has also been enrolled in an automated discharge follow up call series and will receive two outreach attempts for transition of care assessment. Contact information has been left for the patient and the Medicaid Managed Care team is available to provide assistance to the patient at any time.  ° °Katrice Yohanna Tow, RN, BSN, CCRN °[facility] °[phone] ° °
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
## 2020-06-11 ENCOUNTER — Telehealth: Payer: Self-pay

## 2020-06-11 NOTE — Telephone Encounter (Signed)
    MA8/24/2021   Name: Taylor Reed   MRN: [id number]   DOB: 1996-08-12   AGE: 24 y.o.   GENDER: female   PCP Patient, No Pcp Per.   06/11/20 Spoke with patient verified that patient does have Medicaid transportation asked her if she needed additional resources for transportation or any other resources and patient stated she did not.  Since no community resources are needed at this time closing referral.    Lawrence Mitch, AAS Paralegal, [facility] Care Guide . Embedded Care Coordination [facility]  Care Management  [address] [place], [place] [address] [email or web address]  [phone]  [email or web address]

## 2020-07-09 ENCOUNTER — Encounter: Payer: Self-pay | Admitting: Obstetrics & Gynecology

## 2020-07-09 ENCOUNTER — Telehealth (INDEPENDENT_AMBULATORY_CARE_PROVIDER_SITE_OTHER): Payer: Medicaid Other | Admitting: Obstetrics & Gynecology

## 2020-07-09 DIAGNOSIS — Z5329 Procedure and treatment not carried out because of patient's decision for other reasons: Secondary | ICD-10-CM

## 2020-07-09 NOTE — Progress Notes (Signed)
   Patient was not available today for her scheduled virtual appointment.  11:05 NO ANSWER FOR INTAKE, unable to leave message on phone. 11:10 NO ANSWER Mychart message sent to call us.  Georgana Curio, RMA & Jaynie Collins, MD, FACOG Obstetrician & Gynecologist, [facility] for [facility], [facility]

## 2022-03-22 IMAGING — US US MFM OB FOLLOW-UP
1 series · 13 of 28 positions shown · non-contrast
Comparison: none

[Series 1: us mfm ob follow-up · 82 acquisitions, 13 frames shown]
[im 4/82]
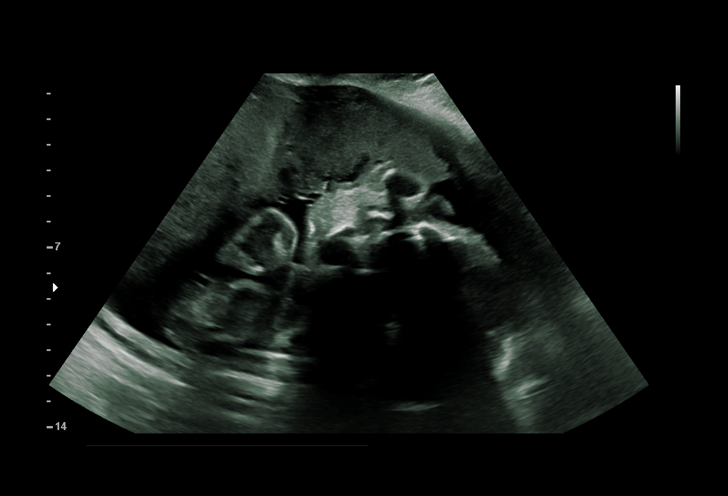
[im 10/82]
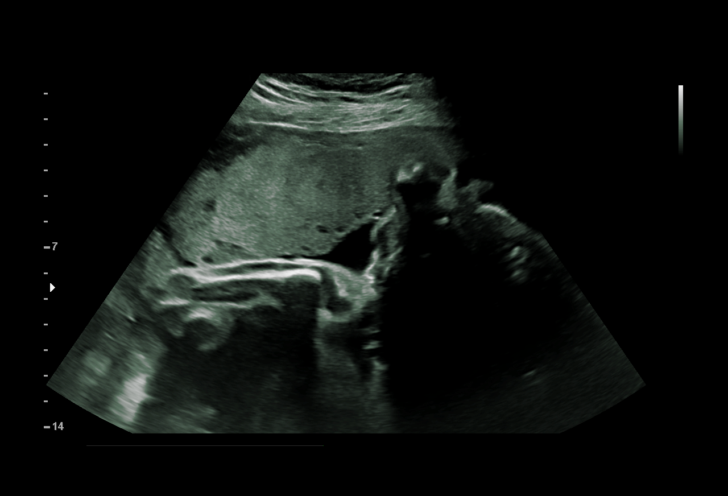
[im 16/82]
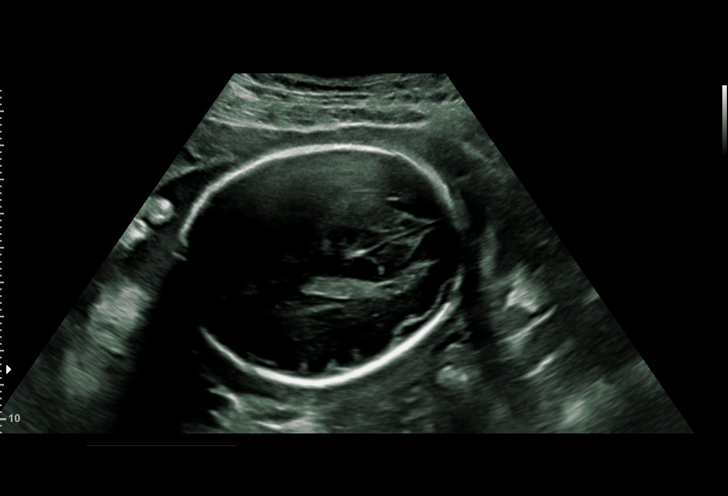
[im 22/82]
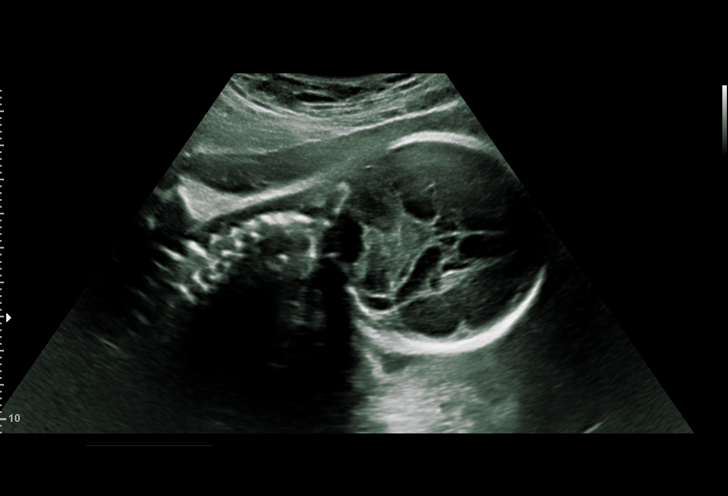
[im 28/82]
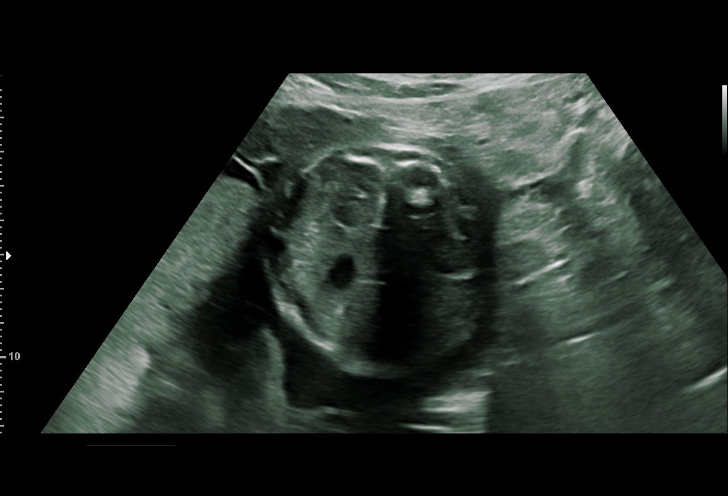
[im 34/82]
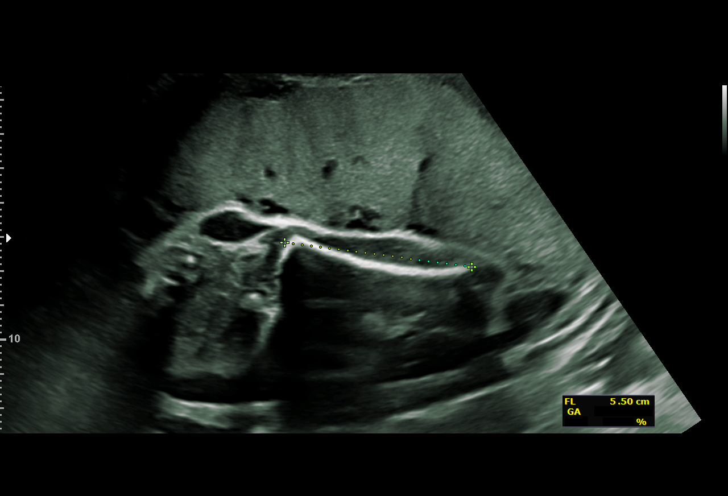
[im 43/82]
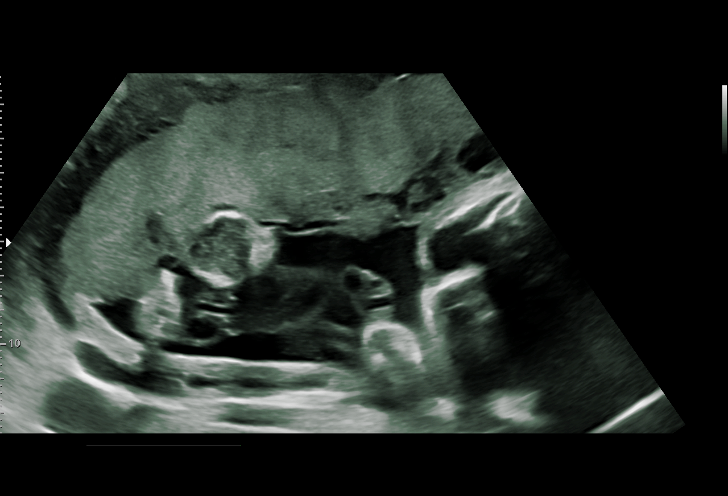
[im 49/82]
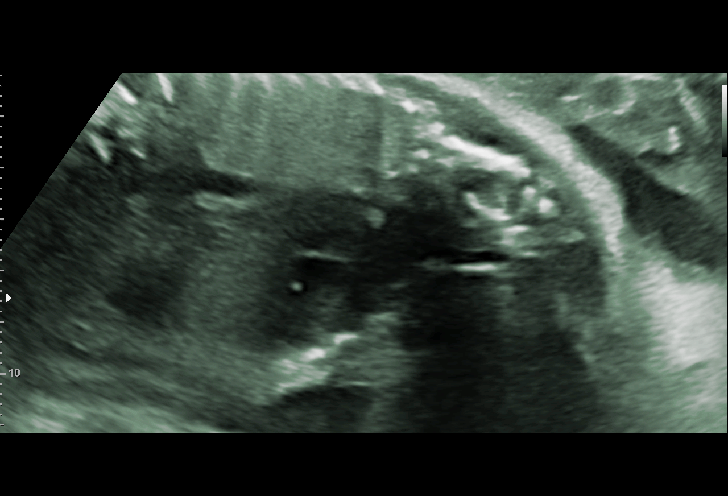
[im 55/82]
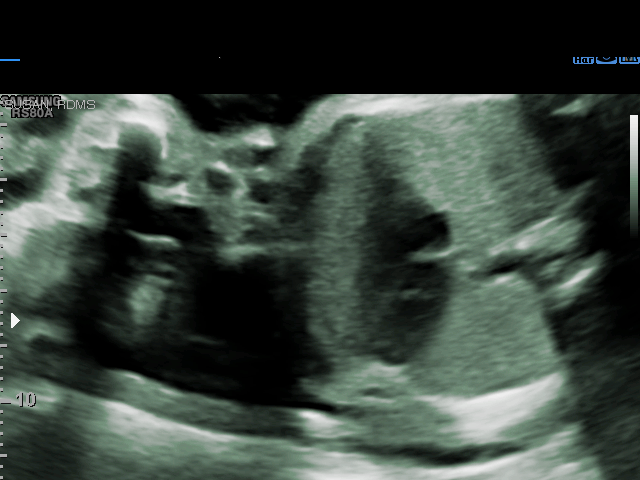
[im 61/82]
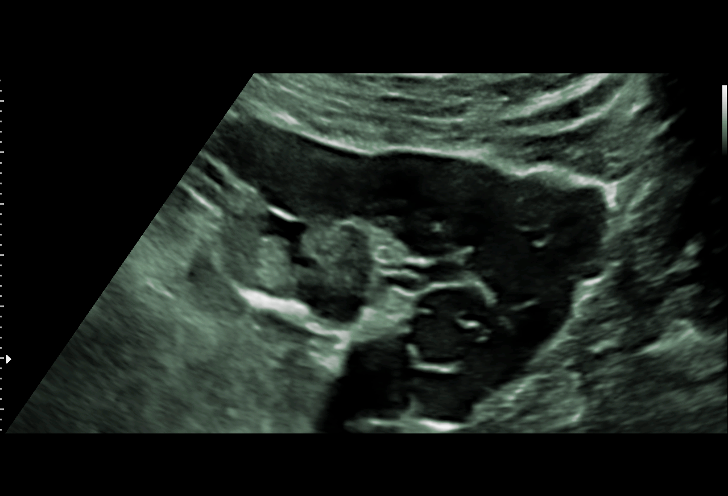
[im 67/82]
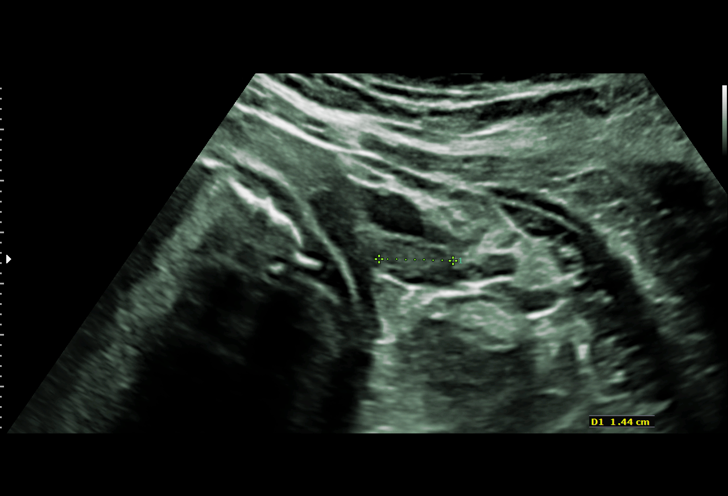
[im 73/82]
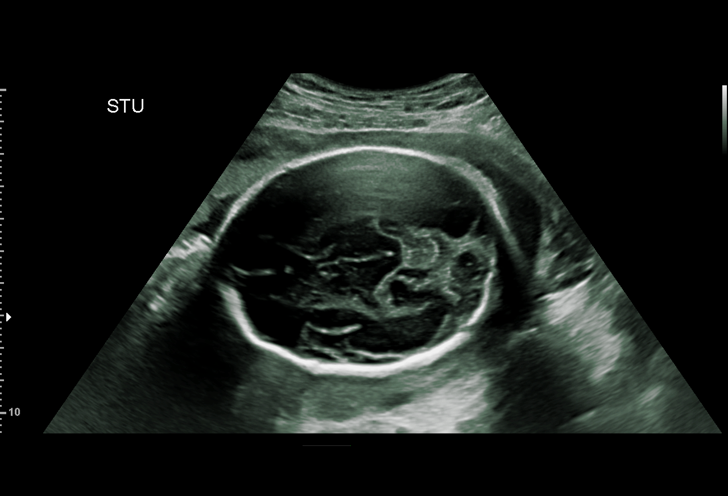
[im 79/82]
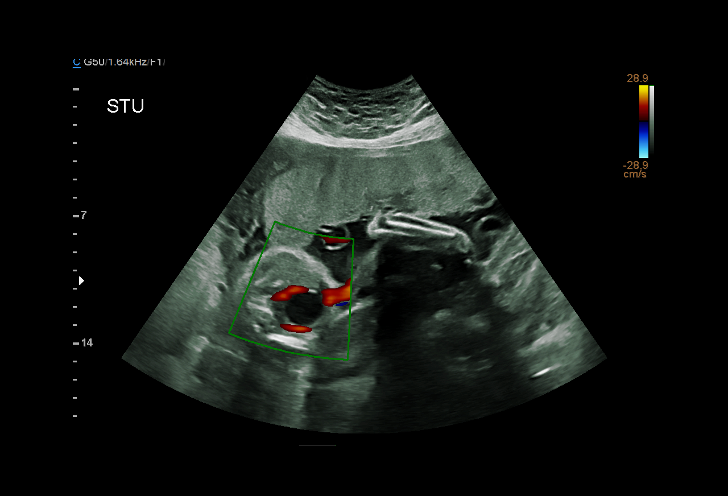

[13 of 28 positions shown; findings below may reference images not displayed]

OLIVIERI CNM

Indications

 Obesity complicating pregnancy, second
 trimester
 28 weeks gestation of pregnancy
 Poor obstetric history: Previous preeclampsia /
 eclampsia/gestational HTN
 Low Risk NIPS
 Short interval between pregancies, 2nd
 trimester
 Encounter for other antenatal screening
 follow-up
 Fetal abnormality - other known or suspected
 (specify) EIF
Fetal Evaluation

 Num Of Fetuses:          1
 Fetal Heart Rate(bpm):   142
 Cardiac Activity:        Observed
 Presentation:            Cephalic
 Placenta:                Anterior
 P. Cord Insertion:       Previously Visualized

 Amniotic Fluid
 AFI FV:      Within normal limits

 AFI Sum(cm)     %Tile       Largest Pocket(cm)
 11.             20

 RUQ(cm)       RLQ(cm)        LUQ(cm)        LLQ(cm)

Biometry

 BPD:      67.6   mm     G. Age:  27w 2d          4  %    CI:         69.52  %    70 - 86
                                                          FL/HC:       20.5  %    19.6 -
 HC:      258.8   mm     G. Age:  28w 1d          6  %    HC/AC:       1.09       0.99 -
 AC:      237.4   mm     G. Age:  28w 0d         20  %    FL/BPD:      78.4  %    71 - 87
 FL:         53   mm     G. Age:  28w 1d         17  %    FL/AC:       22.3  %    20 - 24
 HUM:      49.4   mm     G. Age:  29w 0d         46  %
 CER:      34.6   mm     G. Age:  29w 6d         70  %

 LV:          4   mm

 Est. FW:    6685   gm      2 lb 9 oz     14  %
OB History

 Gravidity:     2         Term:  1
 Living:        1
Gestational Age

 LMP:            28w 6d       Date:  08/24/19                   EDD:  05/30/20
 U/S Today:      27w 6d                                         EDD:  06/06/20
 Best:           28w 6d    Det. By:  LMP  (08/24/19)            EDD:  05/30/20
Anatomy

 Cranium:                Appears normal         Aortic Arch:            Previously seen
 Cavum:                  Previously seen        Ductal Arch:            Previously seen
 Ventricles:             Previously seen        Diaphragm:              Appears normal
 Choroid Plexus:         Resolved CPC           Stomach:                Appears normal, left
                                                                        sided
 Cerebellum:             Appears normal         Abdomen:                Appears normal
 Posterior Fossa:        Appears normal         Abdominal Wall:         Appears nml (cord
                                                                        insert, abd wall)
 Nuchal Fold:            Previously seen        Cord Vessels:           Appears normal (3
                                                                        vessel cord)
 Face:                   Orbits and profile     Kidneys:                Appear normal
                         previously seen
 Lips:                   Appears normal         Bladder:                Appears normal
 Thoracic:               Appears normal         Spine:                  Limited views
                                                                        previously seen
 Heart:                  Echogenic focus        Upper Extremities:      Previously seen
                         in LV
 RVOT:                   Appears normal         Lower Extremities:      Previously seen
 LVOT:                   Appears normal

 Other:   Technically difficult due to maternal habitus and fetal position.
Cervix Uterus Adnexa

 Cervix
 Length:             4.9  cm.
 Normal appearance by transabdominal scan.

 Right Ovary
 Within normal limits.
 Left Ovary
 Within normal limits.
Impression

 Follow up growth performed given maternal BMI
 Normal interval growth with measurements consistent with
 dates.
 EIF as seen previously was observed. Ms. Tiger has low risk
 NIPS.
Recommendations

 Follow up growth in 4 weeks.

## 2023-10-20 ENCOUNTER — Other Ambulatory Visit: Payer: Self-pay

## 2023-10-20 ENCOUNTER — Encounter (HOSPITAL_COMMUNITY): Payer: Self-pay

## 2023-10-20 ENCOUNTER — Emergency Department (HOSPITAL_COMMUNITY)
Admission: EM | Admit: 2023-10-20 | Discharge: 2023-10-20 | Discharge status: Against Medical Advice | Payer: BLUE CROSS/BLUE SHIELD | Attending: Emergency Medicine | Admitting: Emergency Medicine

## 2023-10-20 DIAGNOSIS — Z5321 Procedure and treatment not carried out due to patient leaving prior to being seen by health care provider: Secondary | ICD-10-CM | POA: Insufficient documentation

## 2023-10-20 DIAGNOSIS — F419 Anxiety disorder, unspecified: Secondary | ICD-10-CM | POA: Diagnosis not present

## 2023-10-20 DIAGNOSIS — R079 Chest pain, unspecified: Secondary | ICD-10-CM | POA: Insufficient documentation

## 2023-10-20 DIAGNOSIS — F10129 Alcohol abuse with intoxication, unspecified: Secondary | ICD-10-CM | POA: Diagnosis not present

## 2023-10-20 LAB — CBC WITH DIFFERENTIAL/PLATELET
Abs Immature Granulocytes: 0.01 10*3/uL (ref 0.00–0.07)
Basophils Absolute: 0 10*3/uL (ref 0.0–0.1)
Basophils Relative: 0 %
Eosinophils Absolute: 0.1 10*3/uL (ref 0.0–0.5)
Eosinophils Relative: 1 %
HCT: 41 % (ref 36.0–46.0)
Hemoglobin: 13.3 g/dL (ref 12.0–15.0)
Immature Granulocytes: 0 %
Lymphocytes Relative: 26 %
Lymphs Abs: 2 10*3/uL (ref 0.7–4.0)
MCH: 32.7 pg (ref 26.0–34.0)
MCHC: 32.4 g/dL (ref 30.0–36.0)
MCV: 100.7 fL — ABNORMAL HIGH (ref 80.0–100.0)
Monocytes Absolute: 0.5 10*3/uL (ref 0.1–1.0)
Monocytes Relative: 6 %
Neutro Abs: 5.1 10*3/uL (ref 1.7–7.7)
Neutrophils Relative %: 67 %
Platelets: 286 10*3/uL (ref 150–400)
RBC: 4.07 MIL/uL (ref 3.87–5.11)
RDW: 13.7 % (ref 11.5–15.5)
WBC: 7.6 10*3/uL (ref 4.0–10.5)
nRBC: 0 % (ref 0.0–0.2)

## 2023-10-20 LAB — COMPREHENSIVE METABOLIC PANEL
ALT: 27 U/L (ref 0–44)
AST: 26 U/L (ref 15–41)
Albumin: 4 g/dL (ref 3.5–5.0)
Alkaline Phosphatase: 58 U/L (ref 38–126)
Anion gap: 9 (ref 5–15)
BUN: 10 mg/dL (ref 6–20)
CO2: 20 mmol/L — ABNORMAL LOW (ref 22–32)
Calcium: 9 mg/dL (ref 8.9–10.3)
Chloride: 109 mmol/L (ref 98–111)
Creatinine, Ser: 0.99 mg/dL (ref 0.44–1.00)
GFR, Estimated: >60 mL/min (ref >60)
Glucose, Bld: 93 mg/dL (ref 70–99)
Potassium: 4 mmol/L (ref 3.5–5.1)
Sodium: 138 mmol/L (ref 135–145)
Total Bilirubin: 0.2 mg/dL (ref 0.0–1.2)
Total Protein: 8.2 g/dL — ABNORMAL HIGH (ref 6.5–8.1)

## 2023-10-20 LAB — ETHANOL: Alcohol, Ethyl (B): 193 mg/dL — ABNORMAL HIGH (ref <10)

## 2023-10-20 NOTE — ED Provider Triage Note (Signed)
 Emergency Medicine Provider Triage Evaluation Note  Taylor Reed , a 28 y.o. female  was evaluated in triage.  Pt complains of alcohol intoxication.  Patient reports that she drank a bottle of tequila.  States she typically does not drink this heavily and feels that she may have had a panic attack as she had some feelings of accelerated heart rate, chest discomfort, and shortness of breath.  This is now resolved.  She does note a history of anxiety but not managed with any medications at this time.  Denies any other significant substance use.  She did report to me that she has been using marijuana recently.  Review of Systems  Positive: As above Negative: As above  Physical Exam  BP 118/74 (BP Location: Right Arm)   Pulse 92   Temp 98.2 F (36.8 C) (Oral)   Resp 18   Ht 5' 4 (1.626 m)   Wt 113.4 kg   SpO2 100%   BMI 42.91 kg/m  Gen:   Awake, no distress   Resp:  Normal effort  MSK:   Moves extremities without difficulty  Other:  Slurred speech and corneal injection  Medical Decision Making  Medically screening exam initiated at 5:29 PM.  Appropriate orders placed.  LAKEETA DOBOSZ was informed that the remainder of the evaluation will be completed by another provider, this initial triage assessment does not replace that evaluation, and the importance of remaining in the ED until their evaluation is complete.     Cabella Kimm A, PA-C 10/20/23 1730

## 2023-10-20 NOTE — ED Triage Notes (Signed)
 Pt BIB EMS with reports of etoh and anxiety. Pt complains of chest pain on inspiration. Upon arrival pt was sitting on the steps, husband reports that pt had too much to drink which may include a full bottle of tequila. Pt has been drinking since yesterday and smoking marijuana.

## 2024-08-11 ENCOUNTER — Emergency Department (HOSPITAL_COMMUNITY)

## 2024-08-11 ENCOUNTER — Encounter (HOSPITAL_COMMUNITY): Payer: Self-pay

## 2024-08-11 ENCOUNTER — Other Ambulatory Visit: Payer: Self-pay

## 2024-08-11 ENCOUNTER — Emergency Department (HOSPITAL_COMMUNITY)
Admission: EM | Admit: 2024-08-11 | Discharge: 2024-08-11 | Disposition: A | Discharge status: Home / Self Care | Attending: Emergency Medicine | Admitting: Emergency Medicine

## 2024-08-11 DIAGNOSIS — W1839XA Other fall on same level, initial encounter: Secondary | ICD-10-CM | POA: Insufficient documentation

## 2024-08-11 DIAGNOSIS — Z91040 Latex allergy status: Secondary | ICD-10-CM | POA: Diagnosis not present

## 2024-08-11 DIAGNOSIS — S93402A Sprain of unspecified ligament of left ankle, initial encounter: Secondary | ICD-10-CM | POA: Insufficient documentation

## 2024-08-11 DIAGNOSIS — M25572 Pain in left ankle and joints of left foot: Secondary | ICD-10-CM | POA: Diagnosis present

## 2024-08-11 NOTE — ED Notes (Signed)
 Patient is very intoxicated.

## 2024-08-11 NOTE — Discharge Instructions (Signed)
 Follow up with orthopedics if not improving. Can apply ice for 20 minutes at a time, elevate to help with pain and swelling. Motrin  and Tylenol  as needed as directed.

## 2024-08-11 NOTE — ED Triage Notes (Signed)
 Patient reports was playing with her little big brother and he landed on her left ankle and foot.  Complaining of pain to left ankle and foot.
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
## 2024-08-11 NOTE — ED Notes (Signed)
 Transported to xray

## 2024-08-11 NOTE — Progress Notes (Signed)
 Orthopedic Tech Progress Note Patient Details:  Taylor Reed [birth year] [id number] Patient was too intoxicated to go over crutch training but based off my observation it looked like she knew how to use them. Ortho Devices Type of Ortho Device: Crutches, CAM walker Ortho Device/Splint Location: L ANKLE Ortho Device/Splint Interventions: Ordered, Application, Adjustment   Post Interventions Patient Tolerated: Well Instructions Provided: Care of device  Deyonte Cadden L Chudney Scheffler 08/11/2024, 4:30 AM
